# Patient Record
Sex: Female | Born: 1955 | Race: Black or African American | Hispanic: No | Marital: Married | Smoking: Never smoker
Health system: Southern US, Community
[De-identification: ages and names within clinical notes are randomized; demographics above are authoritative.]

## PROBLEM LIST (undated history)

## (undated) DIAGNOSIS — E079 Disorder of thyroid, unspecified: Secondary | ICD-10-CM

## (undated) DIAGNOSIS — I10 Essential (primary) hypertension: Secondary | ICD-10-CM

## (undated) HISTORY — PX: ABDOMINAL HYSTERECTOMY: SHX81

---

## 2004-09-06 ENCOUNTER — Ambulatory Visit: Payer: Self-pay | Admitting: General Practice

## 2005-10-09 ENCOUNTER — Ambulatory Visit: Payer: Self-pay | Admitting: General Practice

## 2006-10-14 ENCOUNTER — Ambulatory Visit: Payer: Self-pay | Admitting: Internal Medicine

## 2007-01-16 ENCOUNTER — Ambulatory Visit: Payer: Self-pay | Admitting: Obstetrics and Gynecology

## 2007-01-20 ENCOUNTER — Inpatient Hospital Stay: Payer: Self-pay | Admitting: Obstetrics and Gynecology

## 2007-09-30 ENCOUNTER — Emergency Department: Payer: Self-pay | Admitting: Emergency Medicine

## 2007-10-15 ENCOUNTER — Ambulatory Visit: Payer: Self-pay | Admitting: Internal Medicine

## 2008-10-04 ENCOUNTER — Ambulatory Visit: Payer: Self-pay

## 2008-11-10 ENCOUNTER — Ambulatory Visit: Payer: Self-pay

## 2009-10-17 IMAGING — CR NECK SOFT TISSUES - 1+ VIEW
1 series · 3 of 3 positions shown · non-contrast
Comparison: none

REASON FOR EXAM: Fish bone stuck in throat, pain
COMMENTS:

PROCEDURE:     DXR - DXR SOFT TISSUE NECK  - September 30, 2007  [DATE]
RESULT:     No radiopaque soft tissue foreign body is seen. The epiglottis
appears to be normal in size. No retropharyngeal soft tissue swelling is
seen. Incidental note is made that the hyoid bones are prominent.

[Series 1: view not recorded · 0.17mm/px · 3 of 3 slices shown]
[im 1/3]
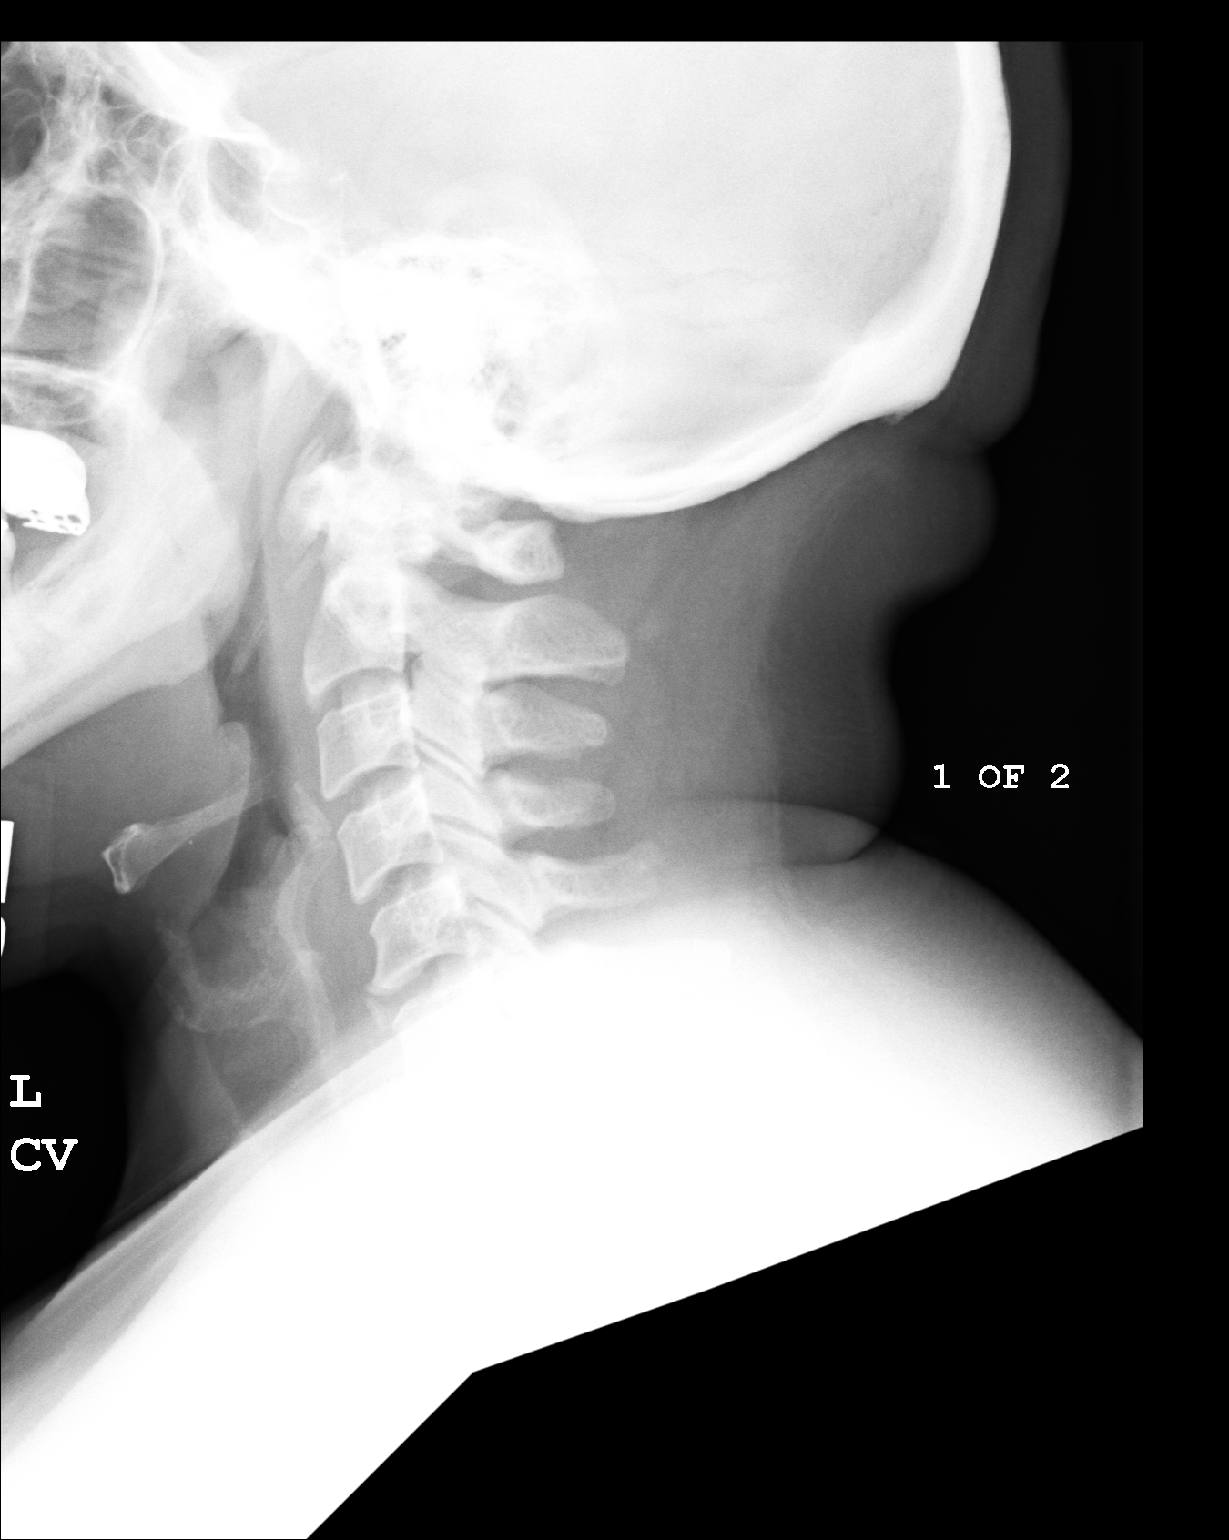
[im 2/3]
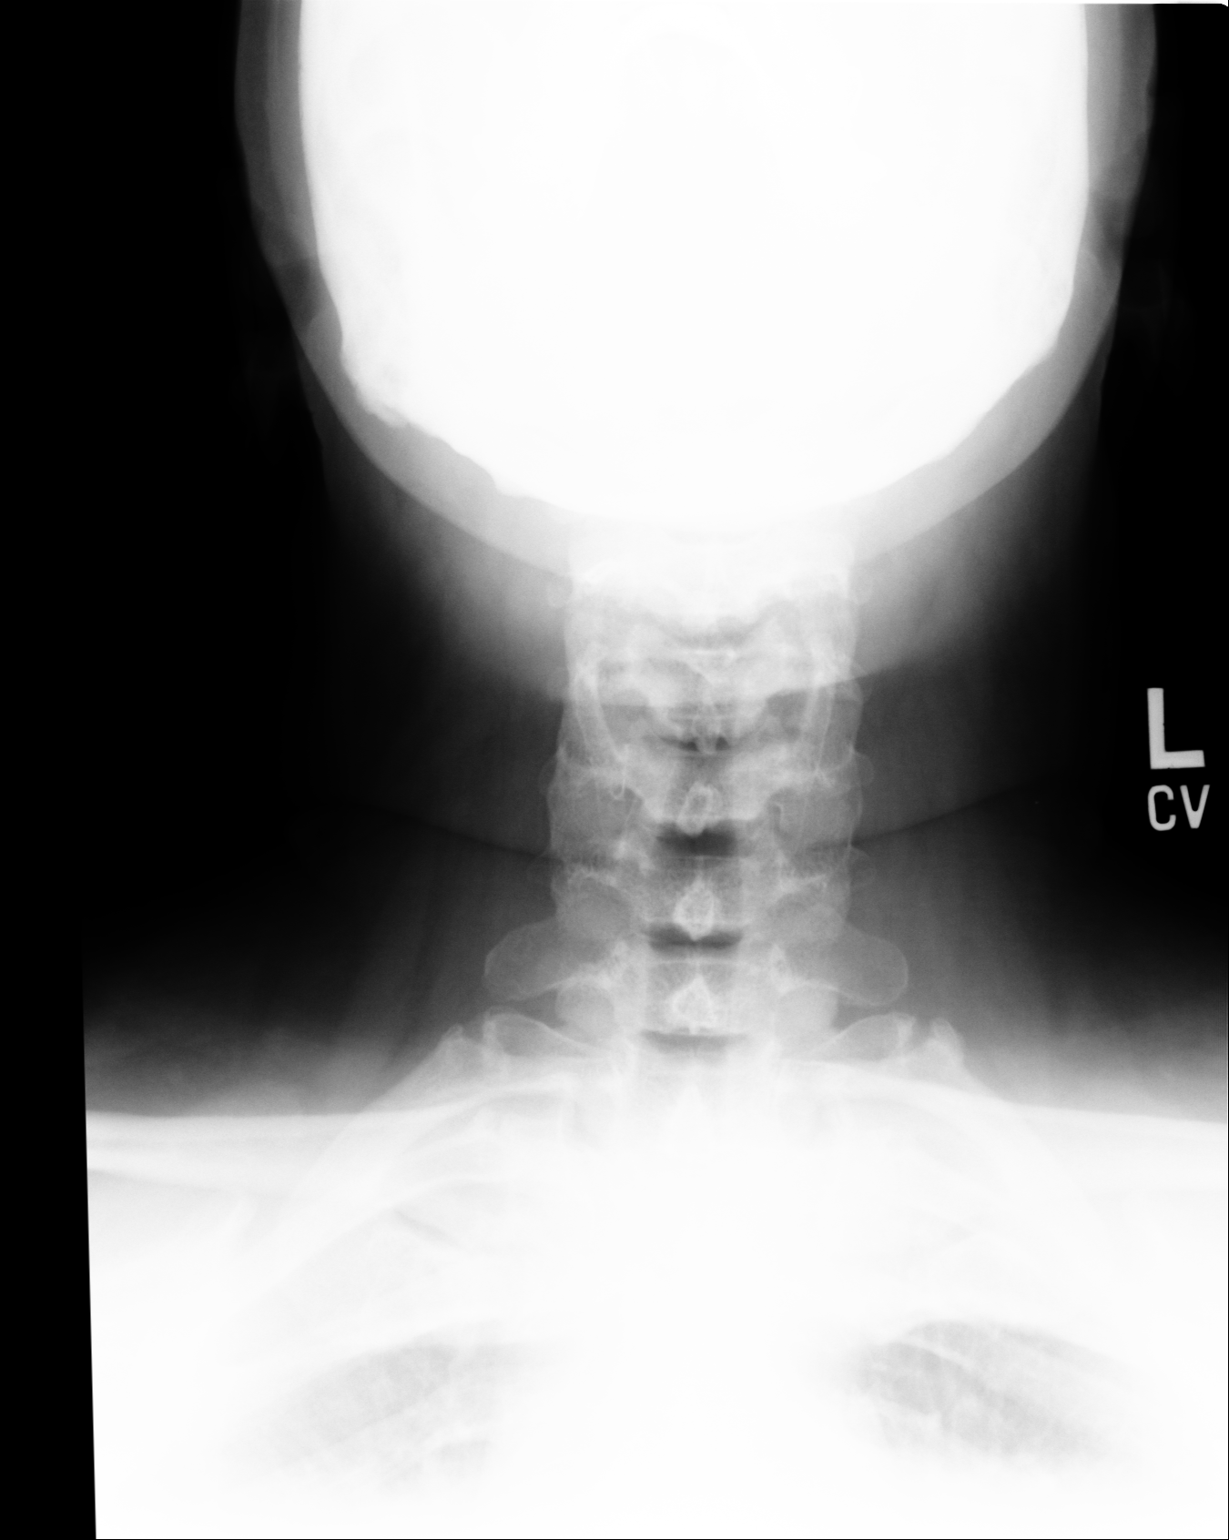
[im 3/3]
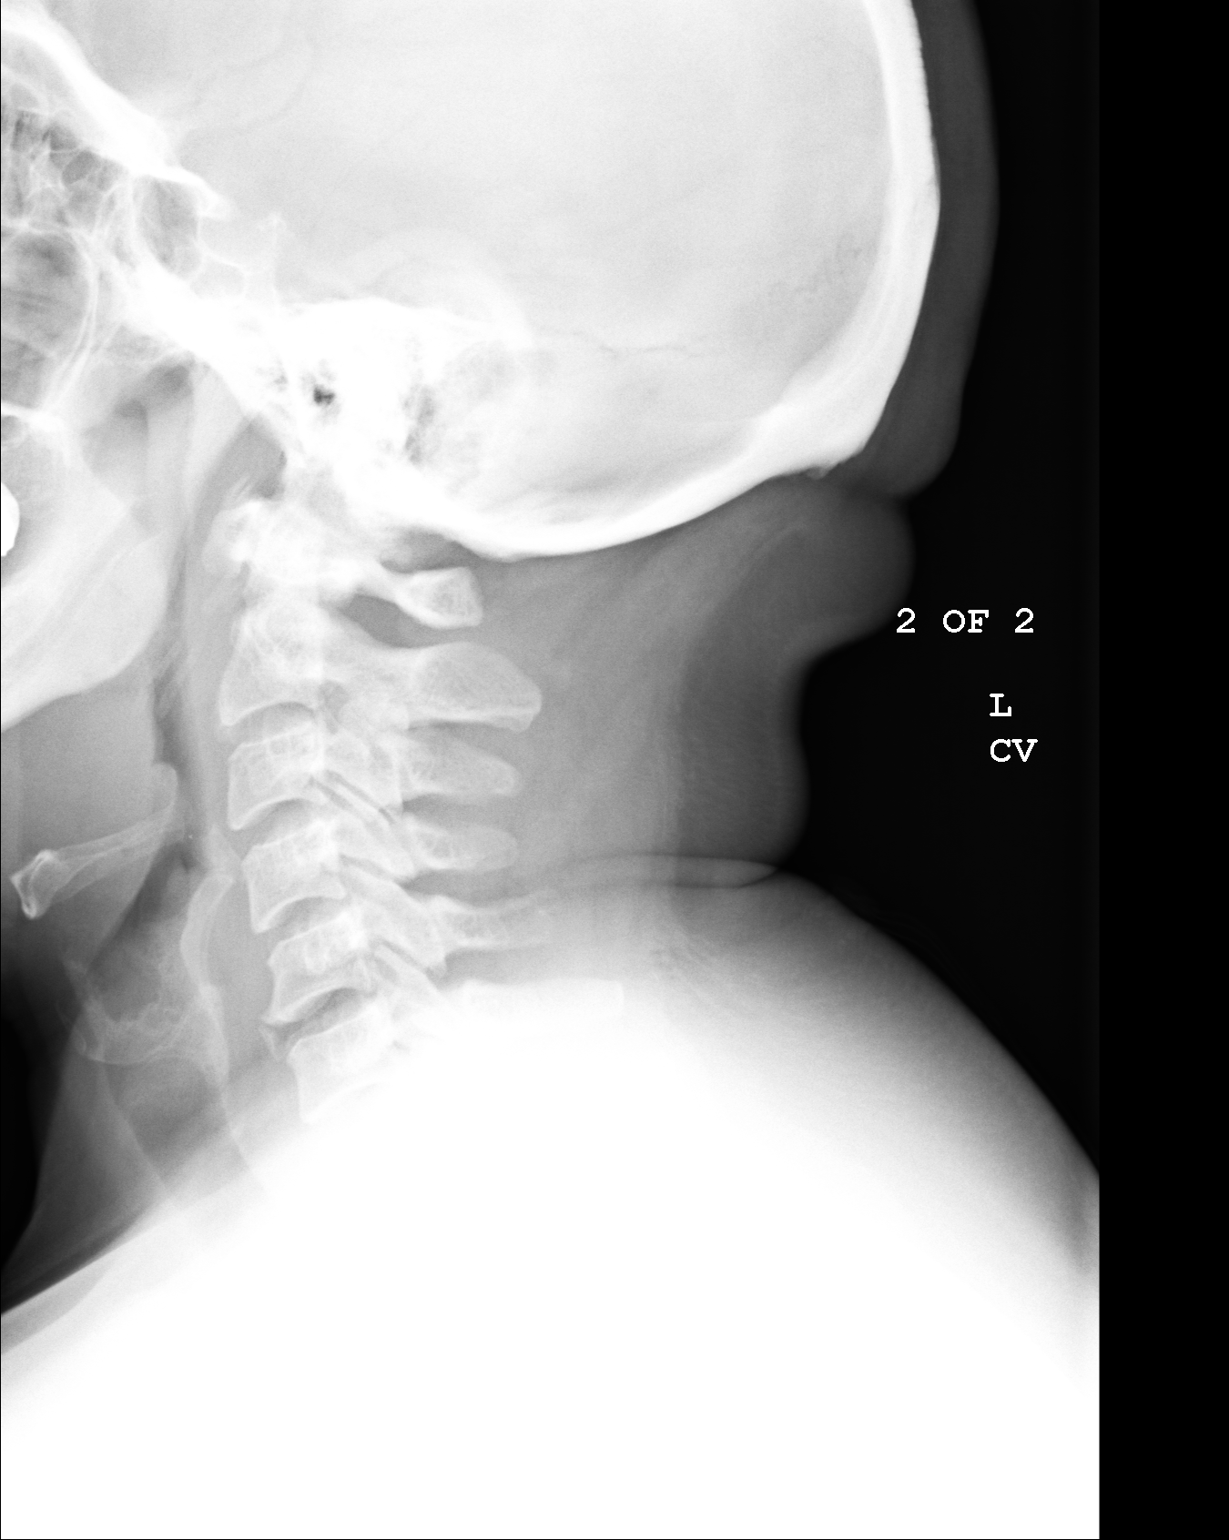

[3 of 3 positions shown; findings below may reference images not displayed]

IMPRESSION: 1.  No acute changes are identified.
2.  No definite opaque foreign body is noted in the cervical esophagus or
pharynx.
3.  Follow-up examination is recommended if symptomatology persists.
4.  Note is made that many fish bones are cartilaginous and may not
visualize on plain film radiography.

## 2010-01-04 ENCOUNTER — Ambulatory Visit: Payer: Self-pay

## 2010-11-14 ENCOUNTER — Ambulatory Visit: Payer: Self-pay | Admitting: Internal Medicine

## 2011-11-29 ENCOUNTER — Ambulatory Visit: Payer: Self-pay | Admitting: Internal Medicine

## 2012-01-18 ENCOUNTER — Ambulatory Visit: Payer: Self-pay | Admitting: General Practice

## 2012-03-21 ENCOUNTER — Ambulatory Visit: Payer: Self-pay | Admitting: Unknown Physician Specialty

## 2012-12-22 ENCOUNTER — Ambulatory Visit: Payer: Self-pay | Admitting: Internal Medicine

## 2014-02-15 ENCOUNTER — Ambulatory Visit: Payer: Self-pay | Admitting: Internal Medicine

## 2015-01-18 ENCOUNTER — Other Ambulatory Visit: Payer: Self-pay | Admitting: Internal Medicine

## 2015-01-18 DIAGNOSIS — Z1231 Encounter for screening mammogram for malignant neoplasm of breast: Secondary | ICD-10-CM

## 2015-02-17 ENCOUNTER — Ambulatory Visit
Admission: RE | Admit: 2015-02-17 | Discharge: 2015-02-17 | Disposition: A | Discharge status: Home / Self Care | Payer: 59 | Source: Ambulatory Visit | Attending: Internal Medicine | Admitting: Internal Medicine

## 2015-02-17 ENCOUNTER — Other Ambulatory Visit: Payer: Self-pay | Admitting: Internal Medicine

## 2015-02-17 DIAGNOSIS — Z1231 Encounter for screening mammogram for malignant neoplasm of breast: Secondary | ICD-10-CM | POA: Insufficient documentation

## 2015-07-13 DIAGNOSIS — Z0000 Encounter for general adult medical examination without abnormal findings: Secondary | ICD-10-CM | POA: Diagnosis not present

## 2015-07-20 DIAGNOSIS — E039 Hypothyroidism, unspecified: Secondary | ICD-10-CM | POA: Diagnosis not present

## 2015-07-20 DIAGNOSIS — I10 Essential (primary) hypertension: Secondary | ICD-10-CM | POA: Diagnosis not present

## 2015-07-20 DIAGNOSIS — E876 Hypokalemia: Secondary | ICD-10-CM | POA: Diagnosis not present

## 2016-02-16 DIAGNOSIS — E039 Hypothyroidism, unspecified: Secondary | ICD-10-CM | POA: Diagnosis not present

## 2016-02-16 DIAGNOSIS — I10 Essential (primary) hypertension: Secondary | ICD-10-CM | POA: Diagnosis not present

## 2016-02-21 DIAGNOSIS — Z0000 Encounter for general adult medical examination without abnormal findings: Secondary | ICD-10-CM | POA: Diagnosis not present

## 2016-04-12 ENCOUNTER — Other Ambulatory Visit: Payer: Self-pay | Admitting: Internal Medicine

## 2016-04-12 DIAGNOSIS — Z1231 Encounter for screening mammogram for malignant neoplasm of breast: Secondary | ICD-10-CM

## 2016-05-09 ENCOUNTER — Ambulatory Visit
Admission: RE | Admit: 2016-05-09 | Discharge: 2016-05-09 | Disposition: A | Discharge status: Home / Self Care | Payer: 59 | Source: Ambulatory Visit | Attending: Internal Medicine | Admitting: Internal Medicine

## 2016-05-09 DIAGNOSIS — Z1231 Encounter for screening mammogram for malignant neoplasm of breast: Secondary | ICD-10-CM | POA: Insufficient documentation

## 2016-08-14 DIAGNOSIS — Z0000 Encounter for general adult medical examination without abnormal findings: Secondary | ICD-10-CM | POA: Diagnosis not present

## 2016-08-21 DIAGNOSIS — E039 Hypothyroidism, unspecified: Secondary | ICD-10-CM | POA: Diagnosis not present

## 2016-08-21 DIAGNOSIS — E876 Hypokalemia: Secondary | ICD-10-CM | POA: Diagnosis not present

## 2016-08-21 DIAGNOSIS — I10 Essential (primary) hypertension: Secondary | ICD-10-CM | POA: Diagnosis not present

## 2017-05-22 DIAGNOSIS — Z0000 Encounter for general adult medical examination without abnormal findings: Secondary | ICD-10-CM | POA: Diagnosis not present

## 2017-05-23 ENCOUNTER — Other Ambulatory Visit: Payer: Self-pay | Admitting: Internal Medicine

## 2017-05-23 DIAGNOSIS — Z1231 Encounter for screening mammogram for malignant neoplasm of breast: Secondary | ICD-10-CM

## 2017-05-27 ENCOUNTER — Ambulatory Visit
Admission: RE | Admit: 2017-05-27 | Discharge: 2017-05-27 | Disposition: A | Discharge status: Home / Self Care | Payer: 59 | Source: Ambulatory Visit | Attending: Internal Medicine | Admitting: Internal Medicine

## 2017-05-27 DIAGNOSIS — Z1231 Encounter for screening mammogram for malignant neoplasm of breast: Secondary | ICD-10-CM | POA: Insufficient documentation

## 2017-05-27 DIAGNOSIS — H524 Presbyopia: Secondary | ICD-10-CM | POA: Diagnosis not present

## 2018-03-24 DIAGNOSIS — M5412 Radiculopathy, cervical region: Secondary | ICD-10-CM | POA: Diagnosis not present

## 2018-03-24 DIAGNOSIS — M542 Cervicalgia: Secondary | ICD-10-CM | POA: Diagnosis not present

## 2018-05-13 ENCOUNTER — Other Ambulatory Visit: Payer: Self-pay | Admitting: Internal Medicine

## 2018-05-13 DIAGNOSIS — Z1231 Encounter for screening mammogram for malignant neoplasm of breast: Secondary | ICD-10-CM

## 2018-05-23 DIAGNOSIS — Z0000 Encounter for general adult medical examination without abnormal findings: Secondary | ICD-10-CM | POA: Diagnosis not present

## 2018-05-28 ENCOUNTER — Ambulatory Visit
Admission: RE | Admit: 2018-05-28 | Discharge: 2018-05-28 | Disposition: A | Discharge status: Home / Self Care | Payer: 59 | Source: Ambulatory Visit | Attending: Internal Medicine | Admitting: Internal Medicine

## 2018-05-28 DIAGNOSIS — Z1231 Encounter for screening mammogram for malignant neoplasm of breast: Secondary | ICD-10-CM | POA: Insufficient documentation

## 2018-07-07 MED FILL — ESTRADIOL 0.5 MG TABS: 0.5 | 90 days supply | Qty: 90 | Fill #0

## 2018-07-10 MED FILL — LEVOTHYROXINE 125 MCG TABLE: 125 | 90 days supply | Qty: 90 | Fill #0

## 2018-10-22 MED FILL — LEVOTHYROXINE 125 MCG TABLE: 125 | 90 days supply | Qty: 90 | Fill #0

## 2018-10-22 MED FILL — SPIRONOLACTONE 25 MG TABS: 25 | 90 days supply | Qty: 90 | Fill #0

## 2018-10-22 MED FILL — POTASSIUM CL ER 10 MEQ TAB: 10 | 90 days supply | Qty: 270 | Fill #0

## 2018-10-22 MED FILL — HYDROCHLOROTHIAZIDE 50 MG T: 50 | 90 days supply | Qty: 90 | Fill #0

## 2018-12-16 MED FILL — ESTRADIOL 0.5 MG TABS: 0.5 | 90 days supply | Qty: 90 | Fill #1

## 2019-02-23 MED FILL — POTASSIUM CL ER 10 MEQ TAB: 10 | 90 days supply | Qty: 270 | Fill #1

## 2019-02-23 MED FILL — LEVOTHYROXINE 125 MCG TABLE: 125 | 90 days supply | Qty: 90 | Fill #1

## 2019-02-23 MED FILL — HYDROCHLOROTHIAZIDE 50 MG T: 50 | 90 days supply | Qty: 90 | Fill #1

## 2019-02-24 MED FILL — SPIRONOLACTONE 25 MG TABS: 25 | 90 days supply | Qty: 90 | Fill #0

## 2019-03-20 ENCOUNTER — Other Ambulatory Visit: Payer: Self-pay

## 2019-03-20 DIAGNOSIS — Z20822 Contact with and (suspected) exposure to covid-19: Secondary | ICD-10-CM

## 2019-03-22 LAB — NOVEL CORONAVIRUS, NAA: SARS-CoV-2, NAA: NOT DETECTED

## 2019-04-02 MED FILL — ESTRADIOL 0.5 MG TABS: 0.5 | 90 days supply | Qty: 90 | Fill #2

## 2019-04-13 MED FILL — ESTRADIOL 0.5 MG TABS: 0.5 | 90 days supply | Qty: 90 | Fill #2

## 2019-05-19 ENCOUNTER — Other Ambulatory Visit: Payer: Self-pay | Admitting: Internal Medicine

## 2019-06-01 ENCOUNTER — Telehealth: Payer: Self-pay

## 2019-06-02 ENCOUNTER — Ambulatory Visit
Admission: RE | Admit: 2019-06-02 | Discharge: 2019-06-02 | Disposition: A | Discharge status: Home / Self Care | Payer: Self-pay | Source: Ambulatory Visit | Attending: Oncology | Admitting: Oncology

## 2019-06-02 ENCOUNTER — Ambulatory Visit: Payer: Self-pay | Attending: Oncology | Admitting: *Deleted

## 2019-06-02 ENCOUNTER — Other Ambulatory Visit: Payer: Self-pay

## 2019-06-02 VITALS — BP 141/75 | HR 72 | Temp 97.0°F | Ht 65.0 in | Wt 272.0 lb

## 2019-06-02 DIAGNOSIS — Z1211 Encounter for screening for malignant neoplasm of colon: Secondary | ICD-10-CM

## 2019-06-02 NOTE — Patient Instructions (Signed)
Gave patient hand-out, Women Staying Healthy, Active and Well from [facility], with education on breast health, pap smears, heart and colon health. 

## 2019-06-02 NOTE — Progress Notes (Signed)
  Subjective:     Patient ID: Katrina Lara, female   DOB: [birth year], 64 y.o.   MRN: [id number]  HPI   Review of Systems     Objective:   Physical Exam Chest:     Breasts:        Right: No swelling, bleeding, inverted nipple, mass, nipple discharge, skin change or tenderness.        Left: No swelling, bleeding, inverted nipple, mass, nipple discharge, skin change or tenderness.     Comments: Large pendulous breast Lymphadenopathy:     Upper Body:     Right upper body: No supraclavicular or axillary adenopathy.     Left upper body: No supraclavicular or axillary adenopathy.        Assessment:     64 year old Black female presents to [facility] for clinical breast exam and mammogram.  Clinical breast exam unremarkable.  Taught self breast awareness.  Patient with a history of hysterectomy for heavy bleeding.  Pap omitted per protocol.  Patient has been screened for eligibility.  She does not have any insurance, Medicare or Medicaid.  She also meets financial eligibility.   Risk Assessment    Risk Scores      06/02/2019   Last edited by: Scarlett Presto, RN   5-year risk: 1.1 %   Lifetime risk: 5.2 %            Plan:     Screening mammogram ordered.  Will follow up per [facility] protocol.

## 2019-06-04 ENCOUNTER — Encounter: Payer: Self-pay | Admitting: *Deleted

## 2019-06-04 NOTE — Progress Notes (Signed)
Letter mailed from the [facility] to inform patient of her normal mammogram results.  Patient is to follow-up with annual screening in one year. 

## 2019-06-08 MED FILL — LEVOTHYROXINE 125 MCG TABLE: 125 | 90 days supply | Qty: 90 | Fill #0

## 2019-06-08 MED FILL — SPIRONOLACTONE 25 MG TABS: 25 | 90 days supply | Qty: 90 | Fill #1

## 2019-06-08 MED FILL — HYDROCHLOROTHIAZIDE 50 MG T: 50 | 90 days supply | Qty: 90 | Fill #0

## 2019-07-16 ENCOUNTER — Ambulatory Visit: Payer: Self-pay | Attending: Internal Medicine

## 2019-07-16 DIAGNOSIS — Z23 Encounter for immunization: Secondary | ICD-10-CM

## 2019-07-16 NOTE — Progress Notes (Signed)
   Covid-19 Vaccination Clinic  Name:  Katrina Lara    MRN: [id number] DOB: 11-10-1955  07/16/2019  [name] was observed post Covid-19 immunization for 15 minutes without incident. She was provided with Vaccine Information Sheet and instruction to access the V-Safe system.   [name] was instructed to call 911 with any severe reactions post vaccine: Marland Kitchen Difficulty breathing  . Swelling of face and throat  . A fast heartbeat  . A bad rash all over body  . Dizziness and weakness   Immunizations Administered    Name Date Dose VIS Date Route   Pfizer COVID-19 Vaccine 07/16/2019  9:45 AM 0.3 mL 03/27/2019 Intramuscular   Manufacturer: [employer], [employer]   Lot: [id number]   NDC: [address]-2

## 2019-08-11 ENCOUNTER — Other Ambulatory Visit: Payer: Self-pay

## 2019-08-11 ENCOUNTER — Emergency Department: Payer: Self-pay

## 2019-08-11 ENCOUNTER — Emergency Department
Admission: EM | Admit: 2019-08-11 | Discharge: 2019-08-11 | Disposition: A | Discharge status: Home / Self Care | Payer: Self-pay | Attending: Emergency Medicine | Admitting: Emergency Medicine

## 2019-08-11 ENCOUNTER — Ambulatory Visit: Payer: Self-pay | Attending: Internal Medicine

## 2019-08-11 DIAGNOSIS — I10 Essential (primary) hypertension: Secondary | ICD-10-CM | POA: Insufficient documentation

## 2019-08-11 DIAGNOSIS — Z23 Encounter for immunization: Secondary | ICD-10-CM

## 2019-08-11 DIAGNOSIS — R1084 Generalized abdominal pain: Secondary | ICD-10-CM | POA: Insufficient documentation

## 2019-08-11 HISTORY — DX: Essential (primary) hypertension: I10

## 2019-08-11 HISTORY — DX: Disorder of thyroid, unspecified: E07.9

## 2019-08-11 LAB — COMPREHENSIVE METABOLIC PANEL
ALT: 26 U/L (ref 0–44)
AST: 33 U/L (ref 15–41)
Albumin: 4.1 g/dL (ref 3.5–5.0)
Alkaline Phosphatase: 77 U/L (ref 38–126)
Anion gap: 14 (ref 5–15)
BUN: 16 mg/dL (ref 8–23)
CO2: 21 mmol/L — ABNORMAL LOW (ref 22–32)
Calcium: 9.2 mg/dL (ref 8.9–10.3)
Chloride: 99 mmol/L (ref 98–111)
Creatinine, Ser: 1.08 mg/dL — ABNORMAL HIGH (ref 0.44–1.00)
GFR calc Af Amer: >60 mL/min (ref >60)
GFR calc non Af Amer: 55 mL/min — ABNORMAL LOW (ref >60)
Glucose, Bld: 100 mg/dL — ABNORMAL HIGH (ref 70–99)
Potassium: 4.3 mmol/L (ref 3.5–5.1)
Sodium: 134 mmol/L — ABNORMAL LOW (ref 135–145)
Total Bilirubin: 1.4 mg/dL — ABNORMAL HIGH (ref 0.3–1.2)
Total Protein: 8.9 g/dL — ABNORMAL HIGH (ref 6.5–8.1)

## 2019-08-11 LAB — URINALYSIS, COMPLETE (UACMP) WITH MICROSCOPIC
Bacteria, UA: NONE SEEN
Bilirubin Urine: NEGATIVE
Glucose, UA: NEGATIVE mg/dL
Hgb urine dipstick: NEGATIVE
Ketones, ur: NEGATIVE mg/dL
Leukocytes,Ua: NEGATIVE
Nitrite: NEGATIVE
Protein, ur: NEGATIVE mg/dL
Specific Gravity, Urine: 1.042 — ABNORMAL HIGH (ref 1.005–1.030)
pH: 5 (ref 5.0–8.0)

## 2019-08-11 LAB — CBC
HCT: 41.7 % (ref 36.0–46.0)
Hemoglobin: 13.5 g/dL (ref 12.0–15.0)
MCH: 29.7 pg (ref 26.0–34.0)
MCHC: 32.4 g/dL (ref 30.0–36.0)
MCV: 91.9 fL (ref 80.0–100.0)
Platelets: 269 10*3/uL (ref 150–400)
RBC: 4.54 MIL/uL (ref 3.87–5.11)
RDW: 13.1 % (ref 11.5–15.5)
WBC: 11.1 10*3/uL — ABNORMAL HIGH (ref 4.0–10.5)
nRBC: 0 % (ref 0.0–0.2)

## 2019-08-11 LAB — LIPASE, BLOOD: Lipase: 27 U/L (ref 11–51)

## 2019-08-11 MED ORDER — SODIUM CHLORIDE 0.9% FLUSH
3.0000 mL | Freq: Once | INTRAVENOUS | Status: AC
  Administered 2019-08-11: 20:00:00 3 mL via INTRAVENOUS

## 2019-08-11 MED ORDER — HYDROMORPHONE HCL 1 MG/ML IJ SOLN
0.5000 mg | Freq: Once | INTRAMUSCULAR | Status: AC
  Administered 2019-08-11: 20:00:00 0.5 mg via INTRAVENOUS
  Filled 2019-08-11: qty 1

## 2019-08-11 MED ORDER — PROMETHAZINE HCL 25 MG PO TABS
25.0000 mg | ORAL_TABLET | Freq: Three times a day (TID) | ORAL | 0 refills | Status: DC | PRN
Start: 2019-08-11 — End: 2020-05-06

## 2019-08-11 MED ORDER — IOHEXOL 300 MG/ML  SOLN
100.0000 mL | Freq: Once | INTRAMUSCULAR | Status: AC | PRN
  Administered 2019-08-11: 20:00:00 100 mL via INTRAVENOUS

## 2019-08-11 MED ORDER — SODIUM CHLORIDE 0.9 % IV BOLUS
1000.0000 mL | Freq: Once | INTRAVENOUS | Status: AC
  Administered 2019-08-11: 1000 mL via INTRAVENOUS

## 2019-08-11 MED ORDER — ONDANSETRON HCL 4 MG/2ML IJ SOLN
4.0000 mg | Freq: Once | INTRAMUSCULAR | Status: AC
  Administered 2019-08-11: 20:00:00 4 mg via INTRAVENOUS
  Filled 2019-08-11: qty 2

## 2019-08-11 MED ORDER — DICYCLOMINE HCL 20 MG PO TABS: 20.0000 mg | ORAL_TABLET | Freq: Three times a day (TID) | ORAL | 0 refills | Status: DC

## 2019-08-11 NOTE — ED Notes (Signed)
Pt to toilet to provided urine sample on own power.

## 2019-08-11 NOTE — ED Provider Notes (Signed)
[facility] Emergency Department Provider Note  ____________________________________________   First MD Initiated Contact with Patient 08/11/19 1937     (approximate)  I have reviewed the triage vital signs and the nursing notes.   HISTORY  Chief Complaint Abdominal Pain    HPI Katrina Lara is a 64 y.o. female with hypertension who comes in with abdominal pain.  Patient's abdominal pain started on Saturday.  Patient stated that she ate out at a restaurant and she started having abdominal pain.  The abdominal pain is located in the left lower quadrant, moderate, worse after eating, nothing seems to make it better.  Patient states that she has not had a bowel movement for a few days and took some MiraLAX and had some smaller bowel movements.  Does not feel any pressure in her rectum.  Patient states that every time she eats or drink she has more back pain than anything.  Denies any fevers.  Physical nauseous but no vomiting.  Has had a prior hysterectomy but no prior abdominal surgeries.  She denies any chest pain or shortness of breath.          Past Medical History:  Diagnosis Date  . Hypertension   . Thyroid disease     There are no problems to display for this patient.   Past Surgical History:  Procedure Laterality Date  . ABDOMINAL HYSTERECTOMY      Prior to Admission medications   Not on File    Allergies Patient has no allergy information on record.  Family History  Problem Relation Age of Onset  . Breast cancer Neg Hx     Social History Denies drugs or daily drinking  Review of Systems Constitutional: No fever/chills Eyes: No visual changes. ENT: No sore throat. Cardiovascular: Denies chest pain. Respiratory: Denies shortness of breath. Gastrointestinal: Positive abdominal pain, nausea, constipation Genitourinary: Negative for dysuria. Musculoskeletal: Negative for back pain. Skin: Negative for rash. Neurological:  Negative for headaches, focal weakness or numbness. All other ROS negative ____________________________________________   PHYSICAL EXAM:  VITAL SIGNS: ED Triage Vitals [08/11/19 1706]  Enc Vitals Group     BP (!) 157/79     Pulse Rate (!) 109     Resp 19     Temp 99.7 F (37.6 C)     Temp src      SpO2 100 %     Weight 270 lb (122.5 kg)     Height 5\' 4"  (1.626 m)     Head Circumference      Peak Flow      Pain Score 10     Pain Loc      Pain Edu?      Excl. in GC?     Constitutional: Alert and oriented. Well appearing and in no acute distress. Eyes: Conjunctivae are normal. EOMI. Head: Atraumatic. Nose: No congestion/rhinnorhea. Mouth/Throat: Mucous membranes are moist.   Neck: No stridor. Trachea Midline. FROM Cardiovascular: Normal rate, regular rhythm. Grossly normal heart sounds.  Good peripheral circulation. Respiratory: Normal respiratory effort.  No retractions. Lungs CTAB. Gastrointestinal: Soft but tender in the left lower quadrant.  No distention. No abdominal bruits.  Musculoskeletal: No lower extremity tenderness nor edema.  No joint effusions. Neurologic:  Normal speech and language. No gross focal neurologic deficits are appreciated.  Skin:  Skin is warm, dry and intact. No rash noted. Psychiatric: Mood and affect are normal. Speech and behavior are normal. GU: Deferred   ____________________________________________   LABS (  all labs ordered are listed, but only abnormal results are displayed)  Labs Reviewed  COMPREHENSIVE METABOLIC PANEL - Abnormal; Notable for the following components:      Result Value   Sodium 134 (*)    CO2 21 (*)    Glucose, Bld 100 (*)    Creatinine, Ser 1.08 (*)    Total Protein 8.9 (*)    Total Bilirubin 1.4 (*)    GFR calc non Af Amer 55 (*)    All other components within normal limits  LIPASE, BLOOD  URINALYSIS, COMPLETE (UACMP) WITH MICROSCOPIC  CBC    ____________________________________________    RADIOLOGY Robert Bellow, personally viewed and evaluated these images (plain radiographs) as part of my medical decision making, as well as reviewing the written report by the radiologist.  ED MD interpretation:  Pending   Official radiology report(s): No results found.  ____________________________________________   PROCEDURES  Procedure(s) performed (including Critical Care):  Procedures   ____________________________________________   INITIAL IMPRESSION / ASSESSMENT AND PLAN / ED COURSE  Katrina Lara was evaluated in Emergency Department on 08/11/2019 for the symptoms described in the history of present illness. She was evaluated in the context of the global COVID-19 pandemic, which necessitated consideration that the patient might be at risk for infection with the SARS-CoV-2 virus that causes COVID-19. Institutional protocols and algorithms that pertain to the evaluation of patients at risk for COVID-19 are in a state of rapid change based on information released by regulatory bodies including the CDC and federal and state organizations. These policies and algorithms were followed during the patient's care in the ED.    Patient is a 64 year old who comes in with abdominal pain some elevated heart rates and low-grade temperatures.  Patient is left lower quadrant pain we discussed CT imaging to make sure no evidence of diverticulitis, obstruction.  No right lower quadrant tenderness to suggest appendicitis or right upper quadrant tenderness to suggest cholecystitis.  Denies any urinary symptoms suggest UTI.  Suspect elevated heart rate secondary to dehydration.  She denies any chest pain or shortness of breath.  Will give some fluids, IV Dilaudid IV Zofran to help with discomfort and will proceed with CT for further evaluation.  White count is slightly elevated 11.1  Labs show slight elevation her kidney function of 1.08 patient  is getting fluids  Pt handed off pending CT scan.      ____________________________________________   FINAL CLINICAL IMPRESSION(S) / ED DIAGNOSES   Final diagnoses:  None      MEDICATIONS GIVEN DURING THIS VISIT:  Medications  sodium chloride flush (NS) 0.9 % injection 3 mL (has no administration in time range)     ED Discharge Orders    None       Note:  This document was prepared using Dragon voice recognition software and may include unintentional dictation errors.   Vanessa , MD 08/11/19 2020

## 2019-08-11 NOTE — ED Notes (Signed)
Pt provided water and crackers for po challange

## 2019-08-11 NOTE — ED Notes (Addendum)
PT to CT. Pt made aware of need for urine sample prior to going to CT. States she does not need to urinate at this time but will inform this nurse when she can urinate.

## 2019-08-11 NOTE — ED Provider Notes (Signed)
Assumed care from Dr. Fuller Plan at 8 PM. Briefly, the patient is a 64 y.o. female with PMHx of  has a past medical history of Hypertension and Thyroid disease. here with abdominal pain. CT pending.   Labs Reviewed  COMPREHENSIVE METABOLIC PANEL - Abnormal; Notable for the following components:      Result Value   Sodium 134 (*)    CO2 21 (*)    Glucose, Bld 100 (*)    Creatinine, Ser 1.08 (*)    Total Protein 8.9 (*)    Total Bilirubin 1.4 (*)    GFR calc non Af Amer 55 (*)    All other components within normal limits  URINALYSIS, COMPLETE (UACMP) WITH MICROSCOPIC - Abnormal; Notable for the following components:   Color, Urine YELLOW (*)    APPearance HAZY (*)    Specific Gravity, Urine 1.042 (*)    All other components within normal limits  CBC - Abnormal; Notable for the following components:   WBC 11.1 (*)    All other components within normal limits  LIPASE, BLOOD    Course of Care: -CT negative. UA negative, mild dehydration noted. Pt tolerating PO. Labs, imaging reviewed. Per Dr. Fuller Plan and pt, likely food-related vs viral GI. Feels much better in ED. Will treat symptomatically, d/c.     Shaune Pollack, MD 08/12/19 [phone]

## 2019-08-11 NOTE — Progress Notes (Signed)
   Covid-19 Vaccination Clinic  Name:  Katrina Lara    MRN: [id number] DOB: 07/09/1955  08/11/2019  [name] was observed post Covid-19 immunization for 15 minutes without incident. She was provided with Vaccine Information Sheet and instruction to access the V-Safe system.   [name] was instructed to call 911 with any severe reactions post vaccine: Marland Kitchen Difficulty breathing  . Swelling of face and throat  . A fast heartbeat  . A bad rash all over body  . Dizziness and weakness   Immunizations Administered    Name Date Dose VIS Date Route   Pfizer COVID-19 Vaccine 08/11/2019 10:21 AM 0.3 mL 06/10/2018 Intramuscular   Manufacturer: [employer], [employer]   Lot: [id number]   NDC: [address]-8

## 2019-08-11 NOTE — ED Triage Notes (Signed)
Pt comes via POV from home with c/o abdominal pain. Pt states this started on Saturday and is getting worse. Pt states lower belly pain.  Pt denies any N/V/D. Pt states she hasn't had BM lately either.  Pt states it hurts to walk, sit and eat.

## 2019-08-19 ENCOUNTER — Emergency Department: Payer: Self-pay

## 2019-08-19 ENCOUNTER — Encounter: Payer: Self-pay | Admitting: Emergency Medicine

## 2019-08-19 ENCOUNTER — Other Ambulatory Visit: Payer: Self-pay

## 2019-08-19 ENCOUNTER — Inpatient Hospital Stay
Admission: EM | Admit: 2019-08-19 | Discharge: 2019-08-20 | DRG: 442 | Disposition: A | Discharge status: Home / Self Care | Payer: Self-pay | Source: Ambulatory Visit | Attending: Internal Medicine | Admitting: Internal Medicine

## 2019-08-19 DIAGNOSIS — Z79899 Other long term (current) drug therapy: Secondary | ICD-10-CM

## 2019-08-19 DIAGNOSIS — I81 Portal vein thrombosis: Principal | ICD-10-CM

## 2019-08-19 DIAGNOSIS — E669 Obesity, unspecified: Secondary | ICD-10-CM | POA: Diagnosis present

## 2019-08-19 DIAGNOSIS — I10 Essential (primary) hypertension: Secondary | ICD-10-CM | POA: Diagnosis present

## 2019-08-19 DIAGNOSIS — E039 Hypothyroidism, unspecified: Secondary | ICD-10-CM | POA: Diagnosis present

## 2019-08-19 DIAGNOSIS — Z8249 Family history of ischemic heart disease and other diseases of the circulatory system: Secondary | ICD-10-CM

## 2019-08-19 DIAGNOSIS — Z6841 Body Mass Index (BMI) 40.0 and over, adult: Secondary | ICD-10-CM

## 2019-08-19 DIAGNOSIS — Z79890 Hormone replacement therapy (postmenopausal): Secondary | ICD-10-CM

## 2019-08-19 DIAGNOSIS — Z20822 Contact with and (suspected) exposure to covid-19: Secondary | ICD-10-CM | POA: Diagnosis present

## 2019-08-19 LAB — CBC WITH DIFFERENTIAL/PLATELET
Abs Immature Granulocytes: 0.04 10*3/uL (ref 0.00–0.07)
Basophils Absolute: 0.1 10*3/uL (ref 0.0–0.1)
Basophils Relative: 1 %
Eosinophils Absolute: 0.1 10*3/uL (ref 0.0–0.5)
Eosinophils Relative: 1 %
HCT: 40.1 % (ref 36.0–46.0)
Hemoglobin: 12.7 g/dL (ref 12.0–15.0)
Immature Granulocytes: 1 %
Lymphocytes Relative: 27 %
Lymphs Abs: 2.4 10*3/uL (ref 0.7–4.0)
MCH: 29.1 pg (ref 26.0–34.0)
MCHC: 31.7 g/dL (ref 30.0–36.0)
MCV: 92 fL (ref 80.0–100.0)
Monocytes Absolute: 0.6 10*3/uL (ref 0.1–1.0)
Monocytes Relative: 6 %
Neutro Abs: 5.7 10*3/uL (ref 1.7–7.7)
Neutrophils Relative %: 64 %
Platelets: 316 10*3/uL (ref 150–400)
RBC: 4.36 MIL/uL (ref 3.87–5.11)
RDW: 12.7 % (ref 11.5–15.5)
WBC: 8.8 10*3/uL (ref 4.0–10.5)
nRBC: 0 % (ref 0.0–0.2)

## 2019-08-19 LAB — COMPREHENSIVE METABOLIC PANEL
ALT: 42 U/L (ref 0–44)
AST: 43 U/L — ABNORMAL HIGH (ref 15–41)
Albumin: 3.5 g/dL (ref 3.5–5.0)
Alkaline Phosphatase: 93 U/L (ref 38–126)
Anion gap: 10 (ref 5–15)
BUN: 15 mg/dL (ref 8–23)
CO2: 24 mmol/L (ref 22–32)
Calcium: 9.2 mg/dL (ref 8.9–10.3)
Chloride: 98 mmol/L (ref 98–111)
Creatinine, Ser: 1.18 mg/dL — ABNORMAL HIGH (ref 0.44–1.00)
GFR calc Af Amer: 58 mL/min — ABNORMAL LOW (ref >60)
GFR calc non Af Amer: 50 mL/min — ABNORMAL LOW (ref >60)
Glucose, Bld: 107 mg/dL — ABNORMAL HIGH (ref 70–99)
Potassium: 3.4 mmol/L — ABNORMAL LOW (ref 3.5–5.1)
Sodium: 132 mmol/L — ABNORMAL LOW (ref 135–145)
Total Bilirubin: 0.8 mg/dL (ref 0.3–1.2)
Total Protein: 9.3 g/dL — ABNORMAL HIGH (ref 6.5–8.1)

## 2019-08-19 LAB — PROTIME-INR
INR: 1.1 (ref 0.8–1.2)
Prothrombin Time: 13.6 seconds (ref 11.4–15.2)

## 2019-08-19 LAB — RESPIRATORY PANEL BY RT PCR (FLU A&B, COVID)
Influenza A by PCR: NEGATIVE
Influenza B by PCR: NEGATIVE
SARS Coronavirus 2 by RT PCR: NEGATIVE

## 2019-08-19 LAB — URINALYSIS, COMPLETE (UACMP) WITH MICROSCOPIC
Bilirubin Urine: NEGATIVE
Glucose, UA: NEGATIVE mg/dL
Hgb urine dipstick: NEGATIVE
Ketones, ur: NEGATIVE mg/dL
Leukocytes,Ua: NEGATIVE
Nitrite: NEGATIVE
Protein, ur: NEGATIVE mg/dL
Specific Gravity, Urine: 1.017 (ref 1.005–1.030)
pH: 6 (ref 5.0–8.0)

## 2019-08-19 LAB — LACTIC ACID, PLASMA: Lactic Acid, Venous: 1.8 mmol/L (ref 0.5–1.9)

## 2019-08-19 LAB — LIPASE, BLOOD: Lipase: 30 U/L (ref 11–51)

## 2019-08-19 LAB — APTT: aPTT: 29 seconds (ref 24–36)

## 2019-08-19 MED ORDER — HEPARIN BOLUS VIA INFUSION
4200.0000 [IU] | Freq: Once | INTRAVENOUS | Status: AC
  Administered 2019-08-19: 4200 [IU] via INTRAVENOUS
  Filled 2019-08-19: qty 4200

## 2019-08-19 MED ORDER — HYDROCHLOROTHIAZIDE 25 MG PO TABS: 50.0000 mg | ORAL_TABLET | Freq: Every day | ORAL | Status: DC

## 2019-08-19 MED ORDER — LEVOTHYROXINE SODIUM 25 MCG PO TABS
125.0000 ug | ORAL_TABLET | Freq: Every day | ORAL | Status: DC
  Administered 2019-08-20: 125 ug via ORAL
  Filled 2019-08-19: qty 1

## 2019-08-19 MED ORDER — PANTOPRAZOLE SODIUM 40 MG PO TBEC
40.0000 mg | DELAYED_RELEASE_TABLET | Freq: Every day | ORAL | Status: DC
  Administered 2019-08-20: 40 mg via ORAL
  Filled 2019-08-19: qty 1

## 2019-08-19 MED ORDER — PROMETHAZINE HCL 25 MG PO TABS
25.0000 mg | ORAL_TABLET | Freq: Three times a day (TID) | ORAL | Status: DC | PRN
  Filled 2019-08-19: qty 1

## 2019-08-19 MED ORDER — POTASSIUM CHLORIDE CRYS ER 20 MEQ PO TBCR
10.0000 meq | EXTENDED_RELEASE_TABLET | Freq: Three times a day (TID) | ORAL | Status: DC
  Administered 2019-08-19 – 2019-08-20 (×3): 10 meq via ORAL
  Filled 2019-08-19 (×3): qty 1

## 2019-08-19 MED ORDER — SODIUM CHLORIDE 0.9 % IV BOLUS
500.0000 mL | Freq: Once | INTRAVENOUS | Status: AC
  Administered 2019-08-19: 500 mL via INTRAVENOUS

## 2019-08-19 MED ORDER — SENNA 8.6 MG PO TABS
1.0000 | ORAL_TABLET | Freq: Two times a day (BID) | ORAL | Status: DC
  Administered 2019-08-19 – 2019-08-20 (×2): 8.6 mg via ORAL
  Filled 2019-08-19 (×2): qty 1

## 2019-08-19 MED ORDER — SODIUM CHLORIDE 0.45 % IV SOLN: INTRAVENOUS | Status: AC

## 2019-08-19 MED ORDER — DICYCLOMINE HCL 20 MG PO TABS: 20.0000 mg | ORAL_TABLET | Freq: Three times a day (TID) | ORAL | Status: DC

## 2019-08-19 MED ORDER — KETOROLAC TROMETHAMINE 30 MG/ML IJ SOLN
30.0000 mg | Freq: Four times a day (QID) | INTRAMUSCULAR | Status: DC | PRN
  Administered 2019-08-19 – 2019-08-20 (×2): 30 mg via INTRAVENOUS
  Filled 2019-08-19 (×2): qty 1

## 2019-08-19 MED ORDER — ZOLPIDEM TARTRATE 5 MG PO TABS: 5.0000 mg | ORAL_TABLET | Freq: Every evening | ORAL | Status: DC | PRN

## 2019-08-19 MED ORDER — IOHEXOL 9 MG/ML PO SOLN
500.0000 mL | ORAL | Status: AC
  Administered 2019-08-19: 500 mL via ORAL

## 2019-08-19 MED ORDER — ONDANSETRON HCL 4 MG/2ML IJ SOLN
4.0000 mg | Freq: Once | INTRAMUSCULAR | Status: AC
  Administered 2019-08-19: 4 mg via INTRAVENOUS
  Filled 2019-08-19: qty 2

## 2019-08-19 MED ORDER — DICYCLOMINE HCL 20 MG PO TABS
20.0000 mg | ORAL_TABLET | Freq: Three times a day (TID) | ORAL | Status: DC
  Administered 2019-08-19 – 2019-08-20 (×3): 20 mg via ORAL
  Filled 2019-08-19 (×6): qty 1

## 2019-08-19 MED ORDER — MORPHINE SULFATE (PF) 4 MG/ML IV SOLN
4.0000 mg | Freq: Once | INTRAVENOUS | Status: AC
  Administered 2019-08-19: 4 mg via INTRAVENOUS
  Filled 2019-08-19: qty 1

## 2019-08-19 MED ORDER — SPIRONOLACTONE 25 MG PO TABS
25.0000 mg | ORAL_TABLET | Freq: Every day | ORAL | Status: DC
  Administered 2019-08-19 – 2019-08-20 (×2): 25 mg via ORAL
  Filled 2019-08-19 (×3): qty 1

## 2019-08-19 MED ORDER — HEPARIN (PORCINE) 25000 UT/250ML-% IV SOLN
1300.0000 [IU]/h | INTRAVENOUS | Status: DC
  Administered 2019-08-19 – 2019-08-20 (×2): 1300 [IU]/h via INTRAVENOUS
  Filled 2019-08-19 (×2): qty 250

## 2019-08-19 MED ORDER — IOHEXOL 300 MG/ML  SOLN
100.0000 mL | Freq: Once | INTRAMUSCULAR | Status: AC | PRN
  Administered 2019-08-19: 100 mL via INTRAVENOUS

## 2019-08-19 NOTE — H&P (Signed)
History and Physical    Katrina Lara YTK:[id number] DOB: 10-24-1955 DOA: 08/19/2019  PCP: Rusty Aus, MD (Confirm with patient/family/NH records and if not entered, this has to be entered at [facility] point of entry) Patient coming from: [place] office  I have personally briefly reviewed patient's old medical records in [place]  Chief Complaint: abdominal pain  HPI: Katrina Lara is a 65 y.o. female with medical history significant of HTN, hypothyroidism  who presents with lower abdominal pain over the last week, mainly in the suprapubic region and left lower quadrant, coming in waves but constant, and associated with nausea but no vomiting.  The patient also has had decreased bowel movements, and no diarrhea.  She denies associated urinary symptoms.  She has had some chills.  The symptoms initially started after she ate some [place] food.  She was seen in the ED last week for this abdominal pain and had a CT that was negative, but the symptoms worsen.  She was then seen in the outpatient clinic today because of persistent pain, fever, chills, sweats and was referred to the ED for further evaluation.  ED Course: Patient is afebrile and hemodynamically stable. Chemistry panel and CBC were normal. Lactic acid 1.8. CT abd/pelvis reveals thrombosis of the main, right and left portal veins. No other thrombosed veins noted. No evidence of ischemic bowel, no evidence of appendicitis. Full dose IV Heparin started. TR[facility] called to admit for mangement and further evaluation.   Review of Systems: As per HPI otherwise 10 point review of systems negative.    Past Medical History:  Diagnosis Date  . Hypertension   . Thyroid disease     Past Surgical History:  Procedure Laterality Date  . ABDOMINAL HYSTERECTOMY     Soc Hx -  Married 40 years. One son, one daughter, 2 grand-daughters 49, 70. She is reitred. She lives with her husband and 76 year old grand daughter.   reports that she has never  smoked. She has never used smokeless tobacco. No history on file for alcohol and drug.  No Known Allergies  Family History  Problem Relation Age of Onset  . Cardiomyopathy Mother   . Hypertension Mother   . Hypertension Father   . Breast cancer Neg Hx      Prior to Admission medications   Medication Sig Start Date End Date Taking? Authorizing Provider  dicyclomine (BENTYL) 20 MG tablet Take 1 tablet (20 mg total) by mouth 4 (four) times daily -  before meals and at bedtime for 5 days. 08/11/19 08/19/19 Yes Duffy Bruce, MD  hydrochlorothiazide (HYDRODIURIL) 50 MG tablet Take 50 mg by mouth daily. 06/08/19  Yes [provider]  levothyroxine (SYNTHROID) 125 MCG tablet Take 125 mcg by mouth daily. 06/08/19  Yes [provider]  potassium chloride (KLOR-CON) 10 MEQ tablet Take 10 mEq by mouth 3 (three) times daily. 05/25/19  Yes [provider]  promethazine (PHENERGAN) 25 MG tablet Take 1 tablet (25 mg total) by mouth every 8 (eight) hours as needed for nausea or vomiting. 08/11/19  Yes Duffy Bruce, MD  spironolactone (ALDACTONE) 25 MG tablet Take 25 mg by mouth daily. 05/25/19  Yes [provider]    Physical Exam: Vitals:   08/19/19 1900 08/19/19 1930 08/19/19 2000 08/19/19 2200  BP: (!) 126/59 126/65 (!) 116/58   Pulse: 80 74 73 83  Resp: 20 19 20 20   Temp:      TempSrc:  SpO2: 99% 99% 97% 98%  Weight:      Height:        Constitutional: NAD, calm, comfortable Vitals:   08/19/19 1900 08/19/19 1930 08/19/19 2000 08/19/19 2200  BP: (!) 126/59 126/65 (!) 116/58   Pulse: 80 74 73 83  Resp: 20 19 20 20   Temp:      TempSrc:      SpO2: 99% 99% 97% 98%  Weight:      Height:       General:  Overweight woman who is uncomfortable 2/2 LLQ abdominal pain. Eyes: PERRL, lids and conjunctivae normal w/o scleral icterus ENMT: Mucous membranes are moist. Posterior pharynx clear of any exudate or lesions.Normal dentition.  Neck: normal, supple, no  masses, no thyromegaly Respiratory: clear to auscultation bilaterally, no wheezing, no crackles. Normal respiratory effort. No accessory muscle use.  Cardiovascular: Regular rate and rhythm, no murmurs / rubs / gallops. No extremity edema. 2+ pedal pulses. No carotid bruits.  Abdomen: obese, LLQ tenderness to palpation, soft, no guarding or rebound. no masses palpated.  Bowel sounds positive.  Musculoskeletal: no clubbing / cyanosis. No joint deformity upper and lower extremities. Good ROM, no contractures. Normal muscle tone.  Skin: no rashes, lesions, ulcers. No induration Neurologic: CN 2-12 grossly intact. Sensation intact,. Strength 5/5 in all 4.  Psychiatric: Normal judgment and insight. Alert and oriented x 3. Anxious mood.     Labs on Admission: I have personally reviewed following labs and imaging studies  CBC: Recent Labs  Lab 08/19/19 1708  WBC 8.8  NEUTROABS 5.7  HGB 12.7  HCT 40.1  MCV 92.0  PLT 316   Basic Metabolic Panel: Recent Labs  Lab 08/19/19 1708  NA 132*  K 3.4*  CL 98  CO2 24  GLUCOSE 107*  BUN 15  CREATININE 1.18*  CALCIUM 9.2   GFR: Estimated Creatinine Clearance: 63.1 mL/min (A) (by C-G formula based on SCr of 1.18 mg/dL (H)). Liver Function Tests: Recent Labs  Lab 08/19/19 1708  AST 43*  ALT 42  ALKPHOS 93  BILITOT 0.8  PROT 9.3*  ALBUMIN 3.5   Recent Labs  Lab 08/19/19 1708  LIPASE 30   No results for input(s): AMMONIA in the last 168 hours. Coagulation Profile: Recent Labs  Lab 08/19/19 1708  INR 1.1   Cardiac Enzymes: No results for input(s): CKTOTAL, CKMB, CKMBINDEX, TROPONINI in the last 168 hours. BNP (last 3 results) No results for input(s): PROBNP in the last 8760 hours. HbA1C: No results for input(s): HGBA1C in the last 72 hours. CBG: No results for input(s): GLUCAP in the last 168 hours. Lipid Profile: No results for input(s): CHOL, HDL, LDLCALC, TRIG, CHOLHDL, LDLDIRECT in the last 72 hours. Thyroid Function  Tests: No results for input(s): TSH, T4TOTAL, FREET4, T3FREE, THYROIDAB in the last 72 hours. Anemia Panel: No results for input(s): VITAMINB12, FOLATE, FERRITIN, TIBC, IRON, RETICCTPCT in the last 72 hours. Urine analysis:    Component Value Date/Time   COLORURINE YELLOW (A) 08/19/2019 1856   APPEARANCEUR CLEAR (A) 08/19/2019 1856   LABSPEC 1.017 08/19/2019 1856   PHURINE 6.0 08/19/2019 1856   GLUCOSEU NEGATIVE 08/19/2019 1856   HGBUR NEGATIVE 08/19/2019 1856   BILIRUBINUR NEGATIVE 08/19/2019 1856   KETONESUR NEGATIVE 08/19/2019 1856   PROTEINUR NEGATIVE 08/19/2019 1856   NITRITE NEGATIVE 08/19/2019 1856   LEUKOCYTESUR NEGATIVE 08/19/2019 1856    Radiological Exams on Admission: CT ABDOMEN PELVIS W CONTRAST  Result Date: 08/19/2019 CLINICAL DATA:  Acute left lower quadrant  abdominal pain. EXAM: CT ABDOMEN AND PELVIS WITH CONTRAST TECHNIQUE: Multidetector CT imaging of the abdomen and pelvis was performed using the standard protocol following bolus administration of intravenous contrast. CONTRAST:  OMNIPAQUE IOHEXOL 300 MG/ML  SOLN COMPARISON:  August 11, 2019. FINDINGS: Lower chest: No acute abnormality. Hepatobiliary: No gallstones or biliary dilatation is noted. No focal parenchymal abnormality is noted in the liver. However, there appears to be thrombosis of the main, left and right portal veins. The splenic and superior mesenteric veins are patent. Pancreas: Unremarkable. No pancreatic ductal dilatation or surrounding inflammatory changes. Spleen: Normal in size without focal abnormality. Adrenals/Urinary Tract: Adrenal glands are unremarkable. Kidneys are normal, without renal calculi, focal lesion, or hydronephrosis. Bladder is unremarkable. Stomach/Bowel: Stomach is within normal limits. Appendix appears normal. No evidence of bowel wall thickening, distention, or inflammatory changes. Vascular/Lymphatic: As noted above, there appears to be acute thrombosis of the main, left and  right portal veins. Abdominal aorta is unremarkable. No adenopathy is noted. Reproductive: Status post hysterectomy. No adnexal masses. Other: Small fat containing right inguinal hernia is noted. No ascites is noted. Musculoskeletal: No acute or significant osseous findings. IMPRESSION: Findings consistent with acute thrombosis of the main, left and right portal veins. Critical Value/emergent results were called by telephone at the time of interpretation on 08/19/2019 at 7:04 pm to provider Dr. Roxan Hockey, who verbally acknowledged these results. Electronically Signed   By: Lupita Raider M.D.   On: 08/19/2019 19:04   DG Chest Portable 1 View  Result Date: 08/19/2019 CLINICAL DATA:  Shortness of breath EXAM: PORTABLE CHEST 1 VIEW COMPARISON:  10/04/2008 FINDINGS: Linear scarring or atelectasis in the right upper lung. No consolidation or effusion. Normal heart size. No pneumothorax. IMPRESSION: No active disease. Electronically Signed   By: Jasmine Pang M.D.   On: 08/19/2019 17:48    EKG: Independently reviewed. NSR normal EKG  Assessment/Plan Active Problems:   PVT (portal vein thrombosis)   HTN (hypertension)   Hypothyroidism  (please populate well all problems here in Problem List. (For example, if patient is on BP meds at home and you resume or decide to hold them, it is a problem that needs to be her. Same for CAD, COPD, HLD and so on)   1. Portal vein thrombosis - patient w/o predisposing medical condition, prior h/o blood clots. Reports normal pregnancies and deliveries. Not a drinker. Plan Med-surg admit  Full dose Heparin anticoagulation  Ketorolac 30 mg IV q 6 for pain  PPI  Coag w/u: AT III, Protein C, S, Lieden V  Once workup complete can switch to NOAC for full 3-6 months tx.  2. HTN- continue home meds  3. Hypothyroidism - chronic problem Plan TSH  Continue home meds  DVT prophylaxis: full dose heparin - pharmacy following  Code Status: full code  Family Communication:  Spoke with Verne Grain, husband. Reviewed Dx and Tx plan. Answered all questions. Disposition Plan: Home in 24-48 hours  Consults called: Dr. Wyn Quaker for vascular surgery was called by EDP - no surgical issues.  Admission status: inpatient    Illene Regulus MD [facility] Pager [phone]  If 7PM-7AM, please contact night-coverage [email or web address] Password [email or web address]  08/19/2019, 10:10 PM

## 2019-08-19 NOTE — ED Triage Notes (Signed)
Pt presents to ED referred by PCP with the following note: "Acute appendicitis with peritonitis-patient looks septic and with progressive symptoms with tachycardia, tachypnea, needs emergent attention. Appears to be a surgical emergency, transferred to the ED."   Pt c/o severe pain to LLQ, is panting in triage with pain waves. 1 set of blood cultures sent with rainbow and lactic acid.

## 2019-08-19 NOTE — Consult Note (Signed)
ANTICOAGULATION CONSULT NOTE - Follow Up Consult  Pharmacy Consult for Heparin  Indication: VTE treatment   No Known Allergies  Patient Measurements: Height: 5\' 5"  (165.1 cm) Weight: 113.9 kg (251 lb) IBW/kg (Calculated) : 57 Heparin Dosing Weight: 84 kg   Vital Signs: Temp: 97.6 F (36.4 C) (05/05 1651) Temp Source: Oral (05/05 1651) BP: 126/59 (05/05 1900) Pulse Rate: 80 (05/05 1900)  Labs: Recent Labs    08/19/19 1708  HGB 12.7  HCT 40.1  PLT 316  CREATININE 1.18*    Estimated Creatinine Clearance: 63.1 mL/min (A) (by C-G formula based on SCr of 1.18 mg/dL (H)).   Medications:  No prior anticoagulants - confirmed with patient.   Assessment: Pharmacy has been consulted for heparin dosing in this patient who was referred by her PCP for "acute appendicitis with peritonitis". Baseline H/H and platelets WNL.   Goal of Therapy:  Heparin level 0.3-0.7 units/ml Monitor platelets by anticoagulation protocol: Yes   Plan:  Baseline labs have been ordered  Heparin DW: 84 kg  Give 4200 units bolus x 1 Start heparin infusion at 1300 units/hr Check anti-Xa level in 6 hours and daily while on heparin, per protocol Continue to monitor H&H and platelets    Katrina Lara R Steffen Hase 08/19/2019,7:35 PM

## 2019-08-19 NOTE — ED Notes (Signed)
CT notified that patient done with drinking contrast.

## 2019-08-19 NOTE — ED Provider Notes (Signed)
[facility] Emergency Department Provider Note ____________________________________________   First MD Initiated Contact with Patient 08/19/19 1715     (approximate)  I have reviewed the triage vital signs and the nursing notes.   HISTORY  Chief Complaint No chief complaint on file.    HPI Katrina Lara is a 64 y.o. female with PMH as noted below who presents with lower abdominal pain over the last week, mainly in the suprapubic region and left lower quadrant, coming in waves but constant, and associated with nausea but no vomiting.  The patient also has had decreased bowel movements, and no diarrhea.  She denies associated urinary symptoms.  She has had some chills.  The symptoms initially started after she ate some [place] food.  She was seen in the ED last week and had a CT that was negative, but the symptoms worsen.  She was then seen in the outpatient clinic today and referred to the ED for further evaluation.  Past Medical History:  Diagnosis Date  . Hypertension   . Thyroid disease     There are no problems to display for this patient.   Past Surgical History:  Procedure Laterality Date  . ABDOMINAL HYSTERECTOMY      Prior to Admission medications   Medication Sig Start Date End Date Taking? Authorizing Provider  dicyclomine (BENTYL) 20 MG tablet Take 1 tablet (20 mg total) by mouth 4 (four) times daily -  before meals and at bedtime for 5 days. 08/11/19 08/16/19  Shaune Pollack, MD  hydrochlorothiazide (HYDRODIURIL) 50 MG tablet Take 50 mg by mouth daily. 06/08/19   [provider]  levothyroxine (SYNTHROID) 125 MCG tablet Take 125 mcg by mouth daily. 06/08/19   [provider]  promethazine (PHENERGAN) 25 MG tablet Take 1 tablet (25 mg total) by mouth every 8 (eight) hours as needed for nausea or vomiting. 08/11/19   Shaune Pollack, MD  spironolactone (ALDACTONE) 25 MG tablet Take 25 mg by mouth daily. 05/25/19   [provider]    Allergies Patient has no known allergies.  Family History  Problem Relation Age of Onset  . Breast cancer Neg Hx     Social History Social History   Tobacco Use  . Smoking status: Never Smoker  . Smokeless tobacco: Never Used  Substance Use Topics  . Alcohol use: Not on file  . Drug use: Not on file    Review of Systems  Constitutional: Positive for chills. Eyes: No redness. ENT: No sore throat. Cardiovascular: Denies chest pain. Respiratory: Positive for shortness of breath. Gastrointestinal: Positive for nausea.  No vomiting or diarrhea.  Genitourinary: Negative for dysuria or hematuria.  Musculoskeletal: Negative for back pain. Skin: Negative for rash. Neurological: Negative for headache.   ____________________________________________   PHYSICAL EXAM:  VITAL SIGNS: ED Triage Vitals [08/19/19 1651]  Enc Vitals Group     BP (!) 137/53     Pulse Rate (!) 102     Resp 20     Temp 97.6 F (36.4 C)     Temp Source Oral     SpO2 98 %     Weight 251 lb (113.9 kg)     Height 5\' 5"  (1.651 m)     Head Circumference      Peak Flow      Pain Score      Pain Loc      Pain Edu?      Excl. in GC?  Constitutional: Alert and oriented.  Uncomfortable appearing but in no acute distress. Eyes: Conjunctivae are normal.  No scleral icterus. Head: Atraumatic. Nose: No congestion/rhinnorhea. Mouth/Throat: Mucous membranes are moist.   Neck: Normal range of motion.  Cardiovascular: Normal rate, regular rhythm.Good peripheral circulation. Respiratory: Intermittently tachypneic when she has waves of pain.  Lungs clear bilaterally. Gastrointestinal: Soft with moderate left lower quadrant and midline suprapubic tenderness.  No distention.  Genitourinary: No CVA tenderness. Musculoskeletal: No lower extremity edema.  Extremities warm and well perfused.  Neurologic:  Normal speech and language. No gross focal neurologic deficits are appreciated.  Skin:   Skin is warm and dry. No rash noted. Psychiatric: Anxious appearing.  ____________________________________________   LABS (all labs ordered are listed, but only abnormal results are displayed)  Labs Reviewed  COMPREHENSIVE METABOLIC PANEL - Abnormal; Notable for the following components:      Result Value   Sodium 132 (*)    Potassium 3.4 (*)    Glucose, Bld 107 (*)    Creatinine, Ser 1.18 (*)    Total Protein 9.3 (*)    AST 43 (*)    GFR calc non Af Amer 50 (*)    GFR calc Af Amer 58 (*)    All other components within normal limits  URINALYSIS, COMPLETE (UACMP) WITH MICROSCOPIC - Abnormal; Notable for the following components:   Color, Urine YELLOW (*)    APPearance CLEAR (*)    Bacteria, UA RARE (*)    All other components within normal limits  RESPIRATORY PANEL BY RT PCR (FLU A&B, COVID)  LACTIC ACID, PLASMA  CBC WITH DIFFERENTIAL/PLATELET  LIPASE, BLOOD  APTT  PROTIME-INR  HEPARIN LEVEL (UNFRACTIONATED)  CBC   ____________________________________________  EKG  ED ECG REPORT I, Arta Silence, the attending physician, personally viewed and interpreted this ECG.  Date: 08/19/2019 EKG Time: 1745 Rate: 80 Rhythm: normal sinus rhythm QRS Axis: normal Intervals: normal ST/T Wave abnormalities: normal Narrative Interpretation: no evidence of acute ischemia  ____________________________________________  RADIOLOGY  CXR: No focal infiltrate or other acute abnormality CT abdomen: Portal vein thrombosis  ____________________________________________   PROCEDURES  Procedure(s) performed: No  Procedures  Critical Care performed: No ____________________________________________   INITIAL IMPRESSION / ASSESSMENT AND PLAN / ED COURSE  Pertinent labs & imaging results that were available during my care of the patient were reviewed by me and considered in my medical decision making (see chart for details).  64 year old female with PMH as noted above  presents from the outpatient clinic with abdominal pain over at least the last week.  I reviewed the past medical records in epic and [place].  She was seen in the ED on 4/27 with left lower quadrant abdominal pain.  She had a slightly elevated WBC count but a negative CT and was discharged with improved symptoms.  She was seen in the outpatient clinic today, where the physician noted:  ASSESSMENT/PLAN:  Acute appendicitis with peritonitis-patient looks septic and with progressive symptoms with tachycardia, tachypnea, needs emergent attention. Appears to be a surgical emergency, transferred to the ED   However, the patient has not had additional imaging today and has no confirmed diagnosis of appendicitis.  On exam she is tender in the left lower quadrant with a soft abdomen and no evidence of acute peritonitis.  She is borderline tachycardic with otherwise normal vital signs, although she does report some shortness of breath and has tachypnea when she has a wave of the pain.  At this time the patient  does not have SIRS criteria or evidence of sepsis.  Differential primarily includes diverticulitis, colitis, UTI/cystitis, ureteral stone, volvulus, or less likely acute appendicitis.  We will obtain lab work-up, urinalysis, CT and reassess.  ----------------------------------------- 8:09 PM on 08/19/2019 -----------------------------------------  CT shows portal vein thrombosis but no other acute abnormality.  The etiology is unclear as the patient has no history of liver disease.  I consulted Dr. Wyn Quaker from vascular surgery who recommended anticoagulation with heparin if the patient was to be admitted.  Given the relatively severe pain that the patient was in, I feel she is more appropriate for inpatient monitoring and further work-up.  I started the patient on a heparin infusion.  I discussed her case with the hospitalist Dr. Debby Bud for  admission.  ____________________________________________   FINAL CLINICAL IMPRESSION(S) / ED DIAGNOSES  Final diagnoses:  Portal vein thrombosis      NEW MEDICATIONS STARTED DURING THIS VISIT:  New Prescriptions   No medications on file     Note:  This document was prepared using Dragon voice recognition software and may include unintentional dictation errors.    Dionne Bucy, MD 08/19/19 2010

## 2019-08-19 NOTE — ED Notes (Signed)
Pt assisted to the bathroom by this RN. UA obtained by this RN. Pt tolerated well.

## 2019-08-19 NOTE — ED Notes (Signed)
Pt transported to CT at this time.

## 2019-08-20 ENCOUNTER — Encounter: Payer: Self-pay | Admitting: Internal Medicine

## 2019-08-20 DIAGNOSIS — E039 Hypothyroidism, unspecified: Secondary | ICD-10-CM

## 2019-08-20 DIAGNOSIS — I81 Portal vein thrombosis: Principal | ICD-10-CM

## 2019-08-20 DIAGNOSIS — I10 Essential (primary) hypertension: Secondary | ICD-10-CM

## 2019-08-20 LAB — CBC
HCT: 35.6 % — ABNORMAL LOW (ref 36.0–46.0)
Hemoglobin: 11.8 g/dL — ABNORMAL LOW (ref 12.0–15.0)
MCH: 30.5 pg (ref 26.0–34.0)
MCHC: 33.1 g/dL (ref 30.0–36.0)
MCV: 92 fL (ref 80.0–100.0)
Platelets: 247 10*3/uL (ref 150–400)
RBC: 3.87 MIL/uL (ref 3.87–5.11)
RDW: 13.2 % (ref 11.5–15.5)
WBC: 9.9 10*3/uL (ref 4.0–10.5)
nRBC: 0 % (ref 0.0–0.2)

## 2019-08-20 LAB — TSH: TSH: 0.416 u[IU]/mL (ref 0.350–4.500)

## 2019-08-20 LAB — ANTITHROMBIN III: AntiThromb III Func: 128 % — ABNORMAL HIGH (ref 75–120)

## 2019-08-20 LAB — HEPARIN LEVEL (UNFRACTIONATED)
Heparin Unfractionated: 0.65 IU/mL (ref 0.30–0.70)
Heparin Unfractionated: 0.5 IU/mL (ref 0.30–0.70)

## 2019-08-20 LAB — HIV ANTIBODY (ROUTINE TESTING W REFLEX): HIV Screen 4th Generation wRfx: NONREACTIVE

## 2019-08-20 MED ORDER — ENSURE ENLIVE PO LIQD: 237.0000 mL | Freq: Two times a day (BID) | ORAL | Status: DC

## 2019-08-20 MED ORDER — APIXABAN 5 MG PO TABS: 10.0000 mg | ORAL_TABLET | Freq: Two times a day (BID) | ORAL | 3 refills | Status: DC

## 2019-08-20 MED ORDER — APIXABAN 5 MG PO TABS
10.0000 mg | ORAL_TABLET | Freq: Two times a day (BID) | ORAL | Status: DC
  Administered 2019-08-20: 10 mg via ORAL
  Filled 2019-08-20 (×2): qty 2

## 2019-08-20 MED ORDER — APIXABAN 5 MG PO TABS: 5.0000 mg | ORAL_TABLET | Freq: Two times a day (BID) | ORAL | Status: DC

## 2019-08-20 NOTE — Discharge Planning (Signed)
Patient IVs and tele removed.  RN assessment and VS revealed stability for DC to home with husband.  Discharge papers given, explained and educated.  Informed of suggested FU and also arranged for [facility].  Once ready, will be wheeled to front and husband transporting home via car.

## 2019-08-20 NOTE — TOC Transition Note (Signed)
Transition of Care [facility]) - CM/SW Discharge Note   Patient Details  Name: Katrina Lara MRN: [id number] Date of Birth: [birth year]  [facility]) CM/SW Contact:  Su Hilt, RN Phone Number: 08/20/2019, 3:40 PM   Clinical Narrative:     Met with the patient and her husband in the room, Provided her with the [facility], I called [facility] and they do have Eliquiis, The physician is sending over to [facility] electronically, I explained to the husband where [facility] was and that he will need to pick it up before 430 when they close, The husband was taken to the entrance by the nurse in a wheelchair so he would be able to go to [facility] before they close.  The patient does have a PCP and will continue to see them, She has no other needs        Patient Goals and CMS Choice        Discharge Placement                       Discharge Plan and Services                                     Social Determinants of Health (SDOH) Interventions     Readmission Risk Interventions No flowsheet data found.

## 2019-08-20 NOTE — ED Notes (Signed)
Pt taken to floor room with RN. Pt on stretcher, with monitor.

## 2019-08-20 NOTE — Consult Note (Signed)
[facility] Vascular Consult Note  MRN : [id number]  Katrina Lara is a 64 y.o. (11-Jun-1955) female who presents with chief complaint of abdominal pain.  History of Present Illness:  The patient is a 64 year old a past medical history of hypertension who presented to the [facility] emergency department with a chief complaint of abdominal pain.   Patient endorses a history of progressively worsening abdominal pain for approximately one week. Pain is intermittent located to the epigastric area worse after eating.  States some nausea however no vomiting.  Denies any fever.  During the patient's work-up in our emergency department she underwent a CTA and was found to have: Findings consistent with acute thrombosis of the main, left and right portal veins.  The patient was initiated on heparin and admitted to the hospital for further care.  Vascular surgery was consulted by Dr. Posey Pronto for hepatic vein thrombosis.  Current Facility-Administered Medications  Medication Dose Route Frequency Provider Last Rate Last Admin  . apixaban (ELIQUIS) tablet 10 mg  10 mg Oral BID Fritzi Mandes, MD       Followed by  . [START ON 08/27/2019] apixaban (ELIQUIS) tablet 5 mg  5 mg Oral BID Fritzi Mandes, MD      . dicyclomine (BENTYL) tablet 20 mg  20 mg Oral TID AC & HS Norins, Heinz Knuckles, MD   20 mg at 08/20/19 1223  . hydrochlorothiazide (HYDRODIURIL) tablet 50 mg  50 mg Oral Daily Norins, Heinz Knuckles, MD      . ketorolac (TORADOL) 30 MG/ML injection 30 mg  30 mg Intravenous Q6H PRN Norins, Heinz Knuckles, MD   30 mg at 08/20/19 1450  . levothyroxine (SYNTHROID) tablet 125 mcg  125 mcg Oral Daily Neena Rhymes, MD   125 mcg at 08/20/19 0522  . pantoprazole (PROTONIX) EC tablet 40 mg  40 mg Oral Daily Norins, Heinz Knuckles, MD   40 mg at 08/20/19 1024  . potassium chloride SA (KLOR-CON) CR tablet 10 mEq  10 mEq Oral TID Neena Rhymes, MD   10 mEq at 08/20/19 1024  .  promethazine (PHENERGAN) tablet 25 mg  25 mg Oral Q8H PRN Norins, Heinz Knuckles, MD      . senna (SENOKOT) tablet 8.6 mg  1 tablet Oral BID Norins, Heinz Knuckles, MD   8.6 mg at 08/20/19 1024  . spironolactone (ALDACTONE) tablet 25 mg  25 mg Oral Daily Norins, Heinz Knuckles, MD   25 mg at 08/20/19 1024   Past Medical History:  Diagnosis Date  . Hypertension   . Thyroid disease    Past Surgical History:  Procedure Laterality Date  . ABDOMINAL HYSTERECTOMY     Social History Social History   Tobacco Use  . Smoking status: Never Smoker  . Smokeless tobacco: Never Used  Substance Use Topics  . Alcohol use: Not on file  . Drug use: Not on file   Family History Family History  Problem Relation Age of Onset  . Cardiomyopathy Mother   . Hypertension Mother   . Hypertension Father   . Hypertension Sister   . Breast cancer Neg Hx   Denies family history of peripheral artery disease, venous disease or renal disease.  No Known Allergies  REVIEW OF SYSTEMS (Negative unless checked)  Constitutional: [] Weight loss  [] Fever  [] Chills Cardiac: [] Chest pain   [] Chest pressure   [] Palpitations   [] Shortness of breath when laying flat   [] Shortness of breath at rest   []   Shortness of breath with exertion. Vascular:  [] Pain in legs with walking   [] Pain in legs at rest   [] Pain in legs when laying flat   [] Claudication   [] Pain in feet when walking  [] Pain in feet at rest  [] Pain in feet when laying flat   [] History of DVT   [] Phlebitis   [] Swelling in legs   [] Varicose veins   [] Non-healing ulcers Pulmonary:   [] Uses home oxygen   [] Productive cough   [] Hemoptysis   [] Wheeze  [] COPD   [] Asthma Neurologic:  [] Dizziness  [] Blackouts   [] Seizures   [] History of stroke   [] History of TIA  [] Aphasia   [] Temporary blindness   [] Dysphagia   [] Weakness or numbness in arms   [] Weakness or numbness in legs Musculoskeletal:  [] Arthritis   [] Joint swelling   [] Joint pain   [] Low back pain Hematologic:  [] Easy bruising   [] Easy bleeding   [] Hypercoagulable state   [] Anemic  [] Hepatitis Gastrointestinal:  [] Blood in stool   [] Vomiting blood  [] Gastroesophageal reflux/heartburn   [] Difficulty swallowing. Genitourinary:  [] Chronic kidney disease   [] Difficult urination  [] Frequent urination  [] Burning with urination   [] Blood in urine Skin:  [] Rashes   [] Ulcers   [] Wounds Psychological:  [] History of anxiety   []  History of major depression.  Positive for abdominal pain and nausea.  Physical Examination  Vitals:   08/20/19 0115 08/20/19 0130 08/20/19 0215 08/20/19 0813  BP:  (!) 115/58 128/66 111/62  Pulse: 66 67 66 67  Resp: (!) 21 (!) 22  16  Temp:   98.1 F (36.7 C) 98 F (36.7 C)  TempSrc:   Oral Oral  SpO2: 98% 99% 100% 96%  Weight:      Height:       Body mass index is 41.77 kg/m. Gen:  WD/WN, NAD Head: [place]/AT, No temporalis wasting. Prominent temp pulse not noted. Ear/Nose/Throat: Hearing grossly intact, nares w/o erythema or drainage, oropharynx w/o Erythema/Exudate Eyes: Sclera non-icteric, conjunctiva clear Neck: Trachea midline.  No JVD.  Pulmonary:  Good air movement, respirations not labored, equal bilaterally.  Cardiac: RRR, normal S1, S2. Vascular:  Vessel Right Left  Radial Palpable Palpable  Ulnar Palpable Palpable  Brachial Palpable Palpable  Carotid Palpable, without bruit Palpable, without bruit  Aorta Not palpable N/A  Femoral Palpable Palpable  Popliteal Palpable Palpable  PT Palpable Palpable  DP Palpable Palpable   Gastrointestinal: soft, non-tender/non-distended. No guarding/reflex.  Musculoskeletal: M/S 5/5 throughout.  Extremities without ischemic changes.  No deformity or atrophy. No edema. Neurologic: Sensation grossly intact in extremities.  Symmetrical.  Speech is fluent. Motor exam as listed above. Psychiatric: Judgment intact, Mood & affect appropriate for pt's clinical situation. Dermatologic: No rashes or ulcers noted.  No cellulitis or open  wounds. Lymph : No Cervical, Axillary, or Inguinal lymphadenopathy.  CBC Lab Results  Component Value Date   WBC 9.9 08/20/2019   HGB 11.8 (L) 08/20/2019   HCT 35.6 (L) 08/20/2019   MCV 92.0 08/20/2019   PLT 247 08/20/2019   BMET    Component Value Date/Time   NA 132 (L) 08/19/2019 1708   K 3.4 (L) 08/19/2019 1708   CL 98 08/19/2019 1708   CO2 24 08/19/2019 1708   GLUCOSE 107 (H) 08/19/2019 1708   BUN 15 08/19/2019 1708   CREATININE 1.18 (H) 08/19/2019 1708   CALCIUM 9.2 08/19/2019 1708   GFRNONAA 50 (L) 08/19/2019 1708   GFRAA 58 (L) 08/19/2019 1708   Estimated Creatinine Clearance: 63.1  mL/min (A) (by C-G formula based on SCr of 1.18 mg/dL (H)).  COAG Lab Results  Component Value Date   INR 1.1 08/19/2019   Radiology CT ABDOMEN PELVIS W CONTRAST  Result Date: 08/19/2019 CLINICAL DATA:  Acute left lower quadrant abdominal pain. EXAM: CT ABDOMEN AND PELVIS WITH CONTRAST TECHNIQUE: Multidetector CT imaging of the abdomen and pelvis was performed using the standard protocol following bolus administration of intravenous contrast. CONTRAST:  OMNIPAQUE IOHEXOL 300 MG/ML  SOLN COMPARISON:  August 11, 2019. FINDINGS: Lower chest: No acute abnormality. Hepatobiliary: No gallstones or biliary dilatation is noted. No focal parenchymal abnormality is noted in the liver. However, there appears to be thrombosis of the main, left and right portal veins. The splenic and superior mesenteric veins are patent. Pancreas: Unremarkable. No pancreatic ductal dilatation or surrounding inflammatory changes. Spleen: Normal in size without focal abnormality. Adrenals/Urinary Tract: Adrenal glands are unremarkable. Kidneys are normal, without renal calculi, focal lesion, or hydronephrosis. Bladder is unremarkable. Stomach/Bowel: Stomach is within normal limits. Appendix appears normal. No evidence of bowel wall thickening, distention, or inflammatory changes. Vascular/Lymphatic: As noted above, there  appears to be acute thrombosis of the main, left and right portal veins. Abdominal aorta is unremarkable. No adenopathy is noted. Reproductive: Status post hysterectomy. No adnexal masses. Other: Small fat containing right inguinal hernia is noted. No ascites is noted. Musculoskeletal: No acute or significant osseous findings. IMPRESSION: Findings consistent with acute thrombosis of the main, left and right portal veins. Critical Value/emergent results were called by telephone at the time of interpretation on 08/19/2019 at 7:04 pm to provider Dr. Roxan Hockey, who verbally acknowledged these results. Electronically Signed   By: Lupita Raider M.D.   On: 08/19/2019 19:04   CT ABDOMEN PELVIS W CONTRAST  Result Date: 08/11/2019 CLINICAL DATA:  Abdominal pain EXAM: CT ABDOMEN AND PELVIS WITH CONTRAST TECHNIQUE: Multidetector CT imaging of the abdomen and pelvis was performed using the standard protocol following bolus administration of intravenous contrast. CONTRAST:  OMNIPAQUE IOHEXOL 300 MG/ML  SOLN COMPARISON:  None. FINDINGS: LOWER CHEST: Normal. HEPATOBILIARY: Normal hepatic contours. No intra- or extrahepatic biliary dilatation. The gallbladder is normal. PANCREAS: Normal pancreas. No ductal dilatation or peripancreatic fluid collection. SPLEEN: Normal. ADRENALS/URINARY TRACT: The adrenal glands are normal. No hydronephrosis, nephroureterolithiasis or solid renal mass. The urinary bladder is normal for degree of distention STOMACH/BOWEL: There is no hiatal hernia. Normal duodenal course and caliber. No small bowel dilatation or inflammation. No focal colonic abnormality. Normal appendix. VASCULAR/LYMPHATIC: Normal course and caliber of the major abdominal vessels. No abdominal or pelvic lymphadenopathy. REPRODUCTIVE: Status post hysterectomy. No adnexal mass. MUSCULOSKELETAL. No bony spinal canal stenosis or focal osseous abnormality. OTHER: None. IMPRESSION: No acute abnormality of the abdomen or pelvis.  Electronically Signed   By: Deatra Robinson M.D.   On: 08/11/2019 20:39   DG Chest Portable 1 View  Result Date: 08/19/2019 CLINICAL DATA:  Shortness of breath EXAM: PORTABLE CHEST 1 VIEW COMPARISON:  10/04/2008 FINDINGS: Linear scarring or atelectasis in the right upper lung. No consolidation or effusion. Normal heart size. No pneumothorax. IMPRESSION: No active disease. Electronically Signed   By: Jasmine Pang M.D.   On: 08/19/2019 17:48   Assessment/Plan The patient is a 64 year old female with a past medical history of hypertension hypothyroidism who presented to the [facility] emergency department with progressively worsening abdominal pain and nausea  1.  Portal vein thrombosis: During the patient's work-up for abdominal pain and nausea a CT  was notable for acute portal vein thrombosis.  Due to the location of the thrombosis endovascular intervention is not indicated.  Agree with the initiation of heparin.  Recommend transitioning to Eliquis 5 mg one tab PO twice daily for at least 6 months.  We will see the patient in our office in approximately 3 months to repeat a CT.  Okay to discharge home once Eliquis has started from a vascular standpoint.  Patient understands the importance of compliance with Eliquis / oral anticoagulation.  We discussed propagation if she were to stop oral anticoagulation.  2.  Hypertension: On appropriate medications. Encouraged good control as its slows the progression of atherosclerotic disease  3.  Hypothyroidism: Asymptomatic at this time. This is followed by the patient's primary care physician.  Discussed with Dr. Weldon Inches, PA-C  08/20/2019 3:39 PM  This note was created with Dragon medical transcription system.  Any error is purely unintentional

## 2019-08-20 NOTE — Progress Notes (Signed)
[facility] [place] NAME: Katrina Lara    MR#:  [id number]  DATE OF BIRTH:  [birth year]  SUBJECTIVE:  patient eating solid. Abdominal pain much improved. Denies any nausea vomiting.  REVIEW OF SYSTEMS:   Review of Systems  Constitutional: Negative for chills, fever and weight loss.  HENT: Negative for ear discharge, ear pain and nosebleeds.   Eyes: Negative for blurred vision, pain and discharge.  Respiratory: Negative for sputum production, shortness of breath, wheezing and stridor.   Cardiovascular: Negative for chest pain, palpitations, orthopnea and PND.  Gastrointestinal: Negative for abdominal pain, diarrhea, nausea and vomiting.  Genitourinary: Negative for frequency and urgency.  Musculoskeletal: Negative for back pain and joint pain.  Neurological: Negative for sensory change, speech change, focal weakness and weakness.  Psychiatric/Behavioral: Negative for depression and hallucinations. The patient is not nervous/anxious.    Tolerating Diet:yes Tolerating PT: ambulatory  DRUG ALLERGIES:  No Known Allergies  VITALS:  Blood pressure 111/62, pulse 67, temperature 98 F (36.7 C), temperature source Oral, resp. rate 16, height 5\' 5"  (1.651 m), weight 113.9 kg, SpO2 96 %.  PHYSICAL EXAMINATION:   Physical Exam  GENERAL:  64 y.o.-year-old patient lying in the bed with no acute distress. Obese EYES: Pupils equal, round, reactive to light and accommodation. No scleral icterus.   HEENT: Head atraumatic, normocephalic. Oropharynx and nasopharynx clear.  NECK:  Supple, no jugular venous distention. No thyroid enlargement, no tenderness.  LUNGS: Normal breath sounds bilaterally, no wheezing, rales, rhonchi. No use of accessory muscles of respiration.  CARDIOVASCULAR: S1, S2 normal. No murmurs, rubs, or gallops.  ABDOMEN: Soft, nontender, nondistended. Bowel sounds present. No organomegaly or mass.  EXTREMITIES: No cyanosis, clubbing  or edema b/l.    NEUROLOGIC: Cranial nerves II through XII are intact. No focal Motor or sensory deficits b/l.   PSYCHIATRIC:  patient is alert and oriented x 3.  SKIN: No obvious rash, lesion, or ulcer.   LABORATORY PANEL:  CBC Recent Labs  Lab 08/20/19 0255  WBC 9.9  HGB 11.8*  HCT 35.6*  PLT 247    Chemistries  Recent Labs  Lab 08/19/19 1708  NA 132*  K 3.4*  CL 98  CO2 24  GLUCOSE 107*  BUN 15  CREATININE 1.18*  CALCIUM 9.2  AST 43*  ALT 42  ALKPHOS 93  BILITOT 0.8   Cardiac Enzymes No results for input(s): TROPONINI in the last 168 hours. RADIOLOGY:  CT ABDOMEN PELVIS W CONTRAST  Result Date: 08/19/2019 CLINICAL DATA:  Acute left lower quadrant abdominal pain. EXAM: CT ABDOMEN AND PELVIS WITH CONTRAST TECHNIQUE: Multidetector CT imaging of the abdomen and pelvis was performed using the standard protocol following bolus administration of intravenous contrast. CONTRAST:  15mL OMNIPAQUE IOHEXOL 300 MG/ML  SOLN COMPARISON:  August 11, 2019. FINDINGS: Lower chest: No acute abnormality. Hepatobiliary: No gallstones or biliary dilatation is noted. No focal parenchymal abnormality is noted in the liver. However, there appears to be thrombosis of the main, left and right portal veins. The splenic and superior mesenteric veins are patent. Pancreas: Unremarkable. No pancreatic ductal dilatation or surrounding inflammatory changes. Spleen: Normal in size without focal abnormality. Adrenals/Urinary Tract: Adrenal glands are unremarkable. Kidneys are normal, without renal calculi, focal lesion, or hydronephrosis. Bladder is unremarkable. Stomach/Bowel: Stomach is within normal limits. Appendix appears normal. No evidence of bowel wall thickening, distention, or inflammatory changes. Vascular/Lymphatic: As noted above, there appears to be acute thrombosis of the main, left  and right portal veins. Abdominal aorta is unremarkable. No adenopathy is noted. Reproductive: Status post  hysterectomy. No adnexal masses. Other: Small fat containing right inguinal hernia is noted. No ascites is noted. Musculoskeletal: No acute or significant osseous findings. IMPRESSION: Findings consistent with acute thrombosis of the main, left and right portal veins. Critical Value/emergent results were called by telephone at the time of interpretation on 08/19/2019 at 7:04 pm to provider Dr. Roxan Hockey, who verbally acknowledged these results. Electronically Signed   By: Lupita Raider M.D.   On: 08/19/2019 19:04   DG Chest Portable 1 View  Result Date: 08/19/2019 CLINICAL DATA:  Shortness of breath EXAM: PORTABLE CHEST 1 VIEW COMPARISON:  10/04/2008 FINDINGS: Linear scarring or atelectasis in the right upper lung. No consolidation or effusion. Normal heart size. No pneumothorax. IMPRESSION: No active disease. Electronically Signed   By: Jasmine Pang M.D.   On: 08/19/2019 17:48   ASSESSMENT AND PLAN:  Katrina Lara is a 64 y.o. female with medical history significant of HTN, hypothyroidism who presents with lower abdominal pain over the last week, mainly in the suprapubic region and left lower quadrant, coming in waves but constant, and associated with nausea but no vomiting.  CT abd/pelvis reveals thrombosis of the main, right and left portal veins.  1. Portal vein thrombosis - patient w/o predisposing medical condition, prior h/o blood clots. Reports normal pregnancies and deliveries. Not a drinker.  -Full dose Heparin anticoagulation for now and  switch to NOAC for full 3-6 months tx -Vascular consult with Dr Dew--await [facility]  -Coag w/u: AT III, Protein C, S, Lieden V--sent out--results pending     2. HTN- continue home meds  3. Hypothyroidism - chronic problem  -Continue home meds  DVT prophylaxis: full dose heparin - pharmacy following  Code Status: full code  Family Communication: Spoke with Katrina Lara, husband. At bedside Disposition Plan: Home in 24-48 hours  Consults called: Dr. Wyn Quaker  for vascular surgery  - no surgical issues.  Admission status: inpatient  Status is: Inpatient  Remains inpatient appropriate because:Ongoing diagnostic testing needed not appropriate for outpatient work up   Dispo: The patient is from: Home              Anticipated d/c is to: Home              Anticipated d/c date is: 1 day              Patient currently is not medically stable to d/c.  TOTAL TIME TAKING CARE OF THIS PATIENT: 35 minutes.  >50% time spent on counselling and coordination of care  Note: This dictation was prepared with Dragon dictation along with smaller phrase technology. Any transcriptional errors that result from this process are unintentional.  Enedina Finner M.D    [facility]   CC: Primary care physician; Danella Penton, MDPatient ID: Katrina Lara, female   DOB: [birth year], 64 y.o.   MRN: [id number]

## 2019-08-20 NOTE — Consult Note (Signed)
ANTICOAGULATION CONSULT NOTE - Follow Up Consult  Pharmacy Consult for Heparin  Indication: VTE treatment   No Known Allergies  Patient Measurements: Height: 5\' 5"  (165.1 cm) Weight: 113.9 kg (251 lb) IBW/kg (Calculated) : 57 Heparin Dosing Weight: 84 kg   Vital Signs: Temp: 98.1 F (36.7 C) (05/06 0215) Temp Source: Oral (05/06 0215) BP: 128/66 (05/06 0215) Pulse Rate: 66 (05/06 0215)  Labs: Recent Labs    08/19/19 1708 08/20/19 0255  HGB 12.7 11.8*  HCT 40.1 35.6*  PLT 316 247  APTT 29  --   LABPROT 13.6  --   INR 1.1  --   HEPARINUNFRC  --  0.65  CREATININE 1.18*  --     Estimated Creatinine Clearance: 63.1 mL/min (A) (by C-G formula based on SCr of 1.18 mg/dL (H)).   Medications:  No prior anticoagulants - confirmed with patient.   Assessment: Pharmacy has been consulted for heparin dosing in this patient who was referred by her PCP for "acute appendicitis with peritonitis". Baseline H/H and platelets WNL.   Goal of Therapy:  Heparin level 0.3-0.7 units/ml Monitor platelets by anticoagulation protocol: Yes   Plan:  05/06 @ 0300 HL 0.65 therapeutic. Will continue current rate and will recheck HL at 0900, CBC stable will continue to monitor.  07/06, PharmD, BCPS Clinical Pharmacist 08/20/2019,6:02 AM

## 2019-08-20 NOTE — Progress Notes (Signed)
Initial Nutrition Assessment  DOCUMENTATION CODES:   Morbid obesity  INTERVENTION:  Provide Ensure Enlive po BID, each supplement provides 350 kcal and 20 grams of protein.  NUTRITION DIAGNOSIS:   Inadequate oral intake related to decreased appetite as evidenced by per patient/family report.  GOAL:   Patient will meet greater than or equal to 90% of their needs  MONITOR:   PO intake, Supplement acceptance, Labs, Weight trends, I & O's  REASON FOR ASSESSMENT:   Malnutrition Screening Tool    ASSESSMENT:   64 year old female with PMHx of HTN, hypothyroidism admitted with portal vein thrombosis.   Met with patient and her husband at bedside. She reports for the past week she has had decreased appetite and intake. She reports a throbbing abdominal pain after eating. Today she was able to eat solid food but intake is still lower than usual. Patient is amenable to trying Ensure. Discussed importance of adequate protein intake.  Patient reports she has lost approximately 20 lbs recently. According to chart patient was 123.4 kg on 06/02/2019. She is now documented to be 113.9 kg (251 lbs). She has lost 9.5 kg (7.7% body weight) over the past 3 months, which is significant for time frame.  Medications reviewed and include: Protonix, potassium chloride 10 mEq TID, senna 1 tablet BID.  Labs reviewed: Sodium 132, Potassium 3.4, Creatinine 1.18.  Patient does not meet criteria for malnutrition at this time.  NUTRITION - FOCUSED PHYSICAL EXAM:    Most Recent Value  Orbital Region  No depletion  Upper Arm Region  No depletion  Thoracic and Lumbar Region  No depletion  Buccal Region  No depletion  Temple Region  No depletion  Clavicle Bone Region  No depletion  Clavicle and Acromion Bone Region  No depletion  Scapular Bone Region  No depletion  Dorsal Hand  No depletion  Patellar Region  No depletion  Anterior Thigh Region  No depletion  Posterior Calf Region  No depletion   Edema (RD Assessment)  -- [non pitting]  Hair  Reviewed  Eyes  Reviewed  Mouth  Reviewed  Skin  Reviewed  Nails  Reviewed     Diet Order:   Diet Order            Diet regular Room service appropriate? Yes; Fluid consistency: Thin  Diet effective now             EDUCATION NEEDS:   No education needs have been identified at this time  Skin:  Skin Assessment: Reviewed RN Assessment  Last BM:  Unknown  Height:   Ht Readings from Last 1 Encounters:  08/19/19 '5\' 5"'$  (1.651 m)   Weight:   Wt Readings from Last 1 Encounters:  08/19/19 113.9 kg   BMI:  Body mass index is 41.77 kg/m.  Estimated Nutritional Needs:   Kcal:  2000-2200  Protein:  100-115 grams  Fluid:  >/= 2 L/day  Jacklynn Barnacle, MS, RD, LDN Pager number available on Amion

## 2019-08-20 NOTE — ED Notes (Signed)
Pt got up and walked to bathroom. Pt room modified, lights dimmed and TV turned on. Pt is on cardiac, bp and pulse ox monitoring.

## 2019-08-20 NOTE — Discharge Summary (Signed)
[facility] - [place] [facility]   PATIENT NAME: Katrina Lara    MR#:  [id number]  DATE OF BIRTH:  1956/02/03  DATE OF ADMISSION:  08/19/2019 ADMITTING PHYSICIAN: Jacques Navy, MD  DATE OF DISCHARGE: 08/20/2019  PRIMARY CARE PHYSICIAN: Danella Penton, MD    ADMISSION DIAGNOSIS:  Portal vein thrombosis [I81] PVT (portal vein thrombosis) [I81]  DISCHARGE DIAGNOSIS:  Portal Vein Thrombosis  SECONDARY DIAGNOSIS:   Past Medical History:  Diagnosis Date  . Hypertension   . Thyroid disease     HOSPITAL COURSE:   Katrina Cheeks Foustis a 64 y.o.femalewith medical history significant ofHTN, hypothyroidismwho presents with lower abdominal pain over the last week, mainly in the suprapubic region and left lower quadrant, coming in waves but constant, and associated with nausea but no vomiting.  CT abd/pelvis reveals thrombosis of the main, right and left portal veins.  1. Portal vein thrombosis - patient w/o predisposing medical condition, prior h/o blood clots. Reports normal pregnancies and deliveries. Not a drinker. -Full dose Heparin anticoagulation for now and  switch to NOAC for full 6 months tx -Vascular consult with Dr Wyn Quaker-- appreciate input. Agrees with oral anticoagulation follow-up in the clinic. -Coag w/u: AT III, Protein C, S, Lieden V--sent out--results pending-- will be followed up with primary care and or vascular sx -- appreciate TOC's input for getting eliquis through med management clinic   2. HTN- continue home meds  3. Hypothyroidism - chronic problem -Continue home meds  DVT prophylaxis:full dose heparin--now on eliquis Code Status:full code Family Communication:Spoke with Verne Grain, husband. At bedside Disposition Plan:Home today Consults called:Dr. Wyn Quaker for vascular surgery   Admission status:inpatient  Status is: Inpatient  Dispo: The patient is from: Home  Anticipated d/c is to: Home   Anticipated d/c date is: 1 day  Patient currently is  medically stable to d/c at present  D/w pt and husband CONSULTS OBTAINED:    DRUG ALLERGIES:  No Known Allergies  DISCHARGE MEDICATIONS:   Allergies as of 08/20/2019   No Known Allergies     Medication List    TAKE these medications   apixaban 5 MG Tabs tablet Commonly known as: ELIQUIS Take 2 tablets (10 mg total) by mouth 2 (two) times daily. And then from 08/27/2019 start taking 5 mg bid   dicyclomine 20 MG tablet Commonly known as: BENTYL Take 1 tablet (20 mg total) by mouth 4 (four) times daily -  before meals and at bedtime for 5 days.   hydrochlorothiazide 50 MG tablet Commonly known as: HYDRODIURIL Take 50 mg by mouth daily.   levothyroxine 125 MCG tablet Commonly known as: SYNTHROID Take 125 mcg by mouth daily.   potassium chloride 10 MEQ tablet Commonly known as: KLOR-CON Take 10 mEq by mouth 3 (three) times daily.   promethazine 25 MG tablet Commonly known as: PHENERGAN Take 1 tablet (25 mg total) by mouth every 8 (eight) hours as needed for nausea or vomiting.   spironolactone 25 MG tablet Commonly known as: ALDACTONE Take 25 mg by mouth daily.       If you experience worsening of your admission symptoms, develop shortness of breath, life threatening emergency, suicidal or homicidal thoughts you must seek medical attention immediately by calling 911 or calling your MD immediately  if symptoms less severe.  You Must read complete instructions/literature along with all the possible adverse reactions/side effects for all the Medicines you take and that have been prescribed to you. Take any new  Medicines after you have completely understood and accept all the possible adverse reactions/side effects.   Please note  You were cared for by a hospitalist during your hospital stay. If you have any questions about your discharge medications or the care you received while you were in the hospital  after you are discharged, you can call the unit and asked to speak with the hospitalist on call if the hospitalist that took care of you is not available. Once you are discharged, your primary care physician will handle any further medical issues. Please note that NO REFILLS for any discharge medications will be authorized once you are discharged, as it is imperative that you return to your primary care physician (or establish a relationship with a primary care physician if you do not have one) for your aftercare needs so that they can reassess your need for medications and monitor your lab values.  DATA REVIEW:   CBC  Recent Labs  Lab 08/20/19 0255  WBC 9.9  HGB 11.8*  HCT 35.6*  PLT 247    Chemistries  Recent Labs  Lab 08/19/19 1708  NA 132*  K 3.4*  CL 98  CO2 24  GLUCOSE 107*  BUN 15  CREATININE 1.18*  CALCIUM 9.2  AST 43*  ALT 42  ALKPHOS 93  BILITOT 0.8    Microbiology Results   Recent Results (from the past 240 hour(s))  Respiratory Panel by RT PCR (Flu A&B, Covid) - Nasopharyngeal Swab     Status: None   Collection Time: 08/19/19  7:30 PM   Specimen: Nasopharyngeal Swab  Result Value Ref Range Status   SARS Coronavirus 2 by RT PCR NEGATIVE NEGATIVE Final    Comment: (NOTE) SARS-CoV-2 target nucleic acids are NOT DETECTED. The SARS-CoV-2 RNA is generally detectable in upper respiratoy specimens during the acute phase of infection. The lowest concentration of SARS-CoV-2 viral copies this assay can detect is 131 copies/mL. A negative result does not preclude SARS-Cov-2 infection and should not be used as the sole basis for treatment or other patient management decisions. A negative result may occur with  improper specimen collection/handling, submission of specimen other than nasopharyngeal swab, presence of viral mutation(s) within the areas targeted by this assay, and inadequate number of viral copies (<131 copies/mL). A negative result must be combined with  clinical observations, patient history, and epidemiological information. The expected result is Negative. Fact Sheet for Patients:  PinkCheek.be Fact Sheet for Healthcare Providers:  GravelBags.it This test is not yet ap proved or cleared by the [place] FDA and  has been authorized for detection and/or diagnosis of SARS-CoV-2 by FDA under an Emergency Use Authorization (EUA). This EUA will remain  in effect (meaning this test can be used) for the duration of the COVID-19 declaration under Section 564(b)(1) of the Act, 21 U.S.C. section 360bbb-3(b)(1), unless the authorization is terminated or revoked sooner.    Influenza A by PCR NEGATIVE NEGATIVE Final   Influenza B by PCR NEGATIVE NEGATIVE Final    Comment: (NOTE) The Xpert Xpress SARS-CoV-2/FLU/RSV assay is intended as an aid in  the diagnosis of influenza from Nasopharyngeal swab specimens and  should not be used as a sole basis for treatment. Nasal washings and  aspirates are unacceptable for Xpert Xpress SARS-CoV-2/FLU/RSV  testing. Fact Sheet for Patients: PinkCheek.be Fact Sheet for Healthcare Providers: GravelBags.it This test is not yet approved or cleared by the [place] FDA and  has been authorized for detection and/or diagnosis of SARS-CoV-2 by  FDA under an Emergency Use Authorization (EUA). This EUA will remain  in effect (meaning this test can be used) for the duration of the  Covid-19 declaration under Section 564(b)(1) of the Act, 21  U.S.C. section 360bbb-3(b)(1), unless the authorization is  terminated or revoked. Performed at [facility], [address] [place], [place] [address]     RADIOLOGY:  CT ABDOMEN PELVIS W CONTRAST  Result Date: 08/19/2019 CLINICAL DATA:  Acute left lower quadrant abdominal pain. EXAM: CT ABDOMEN AND PELVIS WITH CONTRAST TECHNIQUE: Multidetector  CT imaging of the abdomen and pelvis was performed using the standard protocol following bolus administration of intravenous contrast. CONTRAST:  OMNIPAQUE IOHEXOL 300 MG/ML  SOLN COMPARISON:  August 11, 2019. FINDINGS: Lower chest: No acute abnormality. Hepatobiliary: No gallstones or biliary dilatation is noted. No focal parenchymal abnormality is noted in the liver. However, there appears to be thrombosis of the main, left and right portal veins. The splenic and superior mesenteric veins are patent. Pancreas: Unremarkable. No pancreatic ductal dilatation or surrounding inflammatory changes. Spleen: Normal in size without focal abnormality. Adrenals/Urinary Tract: Adrenal glands are unremarkable. Kidneys are normal, without renal calculi, focal lesion, or hydronephrosis. Bladder is unremarkable. Stomach/Bowel: Stomach is within normal limits. Appendix appears normal. No evidence of bowel wall thickening, distention, or inflammatory changes. Vascular/Lymphatic: As noted above, there appears to be acute thrombosis of the main, left and right portal veins. Abdominal aorta is unremarkable. No adenopathy is noted. Reproductive: Status post hysterectomy. No adnexal masses. Other: Small fat containing right inguinal hernia is noted. No ascites is noted. Musculoskeletal: No acute or significant osseous findings. IMPRESSION: Findings consistent with acute thrombosis of the main, left and right portal veins. Critical Value/emergent results were called by telephone at the time of interpretation on 08/19/2019 at 7:04 pm to provider Dr. Roxan Hockey, who verbally acknowledged these results. Electronically Signed   By: Lupita Raider M.D.   On: 08/19/2019 19:04   DG Chest Portable 1 View  Result Date: 08/19/2019 CLINICAL DATA:  Shortness of breath EXAM: PORTABLE CHEST 1 VIEW COMPARISON:  10/04/2008 FINDINGS: Linear scarring or atelectasis in the right upper lung. No consolidation or effusion. Normal heart size. No  pneumothorax. IMPRESSION: No active disease. Electronically Signed   By: Jasmine Pang M.D.   On: 08/19/2019 17:48     CODE STATUS:     Code Status Orders  (From admission, onward)         Start     Ordered   08/19/19 2134  Full code  Continuous     08/19/19 2139        Code Status History    This patient has a current code status but no historical code status.   Advance Care Planning Activity       TOTAL TIME TAKING CARE OF THIS PATIENT: *40* minutes.    Enedina Finner M.D  [facility]    CC: Primary care physician; Danella Penton, MD

## 2019-08-20 NOTE — Plan of Care (Signed)
  Problem: Consults Goal: Venous Thromboembolism Patient Education Description: See Patient Education Module for education specifics. Outcome: Progressing   Problem: Phase I Progression Outcomes Goal: Pain controlled with appropriate interventions Outcome: Progressing Goal: Dyspnea controlled at rest (PE) Outcome: Progressing Goal: Tolerating diet Outcome: Progressing Goal: Initial discharge plan identified Outcome: Progressing Goal: Voiding-avoid urinary catheter unless indicated Outcome: Progressing Goal: Hemodynamically stable Outcome: Progressing Goal: Other Phase I Outcomes/Goals Outcome: Progressing   Problem: Phase II Progression Outcomes Goal: Therapeutic drug levels for anticoagulation Outcome: Progressing Goal: 02 sats trending upward/stable (PE) Outcome: Progressing Goal: Discharge plan established Outcome: Progressing Goal: Tolerating diet Outcome: Progressing Goal: Other Phase II Outcomes/Goals Outcome: Progressing   Problem: Phase III Progression Outcomes Goal: 02 sats stabilized Outcome: Progressing Goal: Activity at appropriate level-compared to baseline Description: (UP IN CHAIR FOR HEMODIALYSIS) Outcome: Progressing Goal: Discharge plan remains appropriate-arrangements made Outcome: Progressing Goal: Other Phase III Outcomes/Goals Outcome: Progressing   Problem: Clinical Measurements: Goal: Ability to maintain clinical measurements within normal limits will improve Outcome: Progressing Goal: Will remain free from infection Outcome: Progressing Goal: Diagnostic test results will improve Outcome: Progressing Goal: Respiratory complications will improve Outcome: Progressing Goal: Cardiovascular complication will be avoided Outcome: Progressing

## 2019-08-20 NOTE — Consult Note (Addendum)
ANTICOAGULATION CONSULT NOTE  Pharmacy Consult for Eliquis  Indication: VTE treatment   No Known Allergies  Patient Measurements: Height: 5\' 5"  (165.1 cm) Weight: 113.9 kg (251 lb) IBW/kg (Calculated) : 57 Heparin Dosing Weight: 84 kg   Vital Signs: Temp: 98 F (36.7 C) (05/06 0813) Temp Source: Oral (05/06 0813) BP: 111/62 (05/06 0813) Pulse Rate: 67 (05/06 0813)  Labs: Recent Labs    08/19/19 1708 08/20/19 0255 08/20/19 0852  HGB 12.7 11.8*  --   HCT 40.1 35.6*  --   PLT 316 247  --   APTT 29  --   --   LABPROT 13.6  --   --   INR 1.1  --   --   HEPARINUNFRC  --  0.65 0.50  CREATININE 1.18*  --   --     Estimated Creatinine Clearance: 63.1 mL/min (A) (by C-G formula based on SCr of 1.18 mg/dL (H)).   Medications:  No prior anticoagulants - confirmed with patient.   Assessment: Pharmacy has been consulted for heparin dosing in this patient who was referred by her PCP for "acute appendicitis with peritonitis". Imaging shows portal vein thrombosis. Baseline H/H and platelets WNL.    Goal of Therapy:  Heparin level 0.3-0.7 units/ml Monitor platelets by anticoagulation protocol: Yes   Plan:  Consult to transition to Eliquis. Will start 10 mg BID x 7 days followed by 5 mg BID.  10/20/19, PharmD Clinical Pharmacist 08/20/2019,10:44 AM

## 2019-08-21 LAB — PROTEIN C, TOTAL: Protein C, Total: 150 % (ref 60–150)

## 2019-08-21 LAB — PROTEIN S PANEL
Protein S Activity: 83 % (ref 63–140)
Protein S Ag, Free: 82 % (ref 57–157)
Protein S Ag, Total: 134 % (ref 60–150)

## 2019-08-21 LAB — PROTEIN C ACTIVITY: Protein C Activity: 134 % (ref 73–180)

## 2019-08-22 ENCOUNTER — Ambulatory Visit: Payer: Self-pay

## 2019-08-26 LAB — FACTOR 5 LEIDEN

## 2019-09-10 MED FILL — HYDROCHLOROTHIAZIDE 50 MG T: 50 | 90 days supply | Qty: 90 | Fill #1

## 2019-09-10 MED FILL — LEVOTHYROXINE 125 MCG TABLE: 125 | 90 days supply | Qty: 90 | Fill #1

## 2019-09-10 MED FILL — SPIRONOLACTONE 25 MG TABS: 25 | 90 days supply | Qty: 90 | Fill #2

## 2019-09-16 ENCOUNTER — Other Ambulatory Visit: Payer: Self-pay

## 2019-09-16 ENCOUNTER — Ambulatory Visit: Payer: Self-pay | Admitting: Pharmacy Technician

## 2019-09-16 DIAGNOSIS — Z79899 Other long term (current) drug therapy: Secondary | ICD-10-CM

## 2019-09-16 NOTE — Progress Notes (Signed)
Completed [facility] application and contract.  Patient agreed to all terms of the [facility] contract.  Patient to provide 2021 Social Security Award Letter and 2020 taxes.  Provided patient with community resource material based on her particular needs.    Eliquis Prescription Application completed with patient.  Forwarded to Dr. Hyacinth Meeker for signature.  Upon receipt of signed application from provider and proof of income from patient, Eliquis Prescription Application will be submitted to [facility].  [name] Care Manager [facility]

## 2019-09-29 ENCOUNTER — Telehealth: Payer: Self-pay | Admitting: Pharmacy Technician

## 2019-09-29 NOTE — Telephone Encounter (Signed)
Received updated proof of income.  Patient eligible to receive medication assistance at [facility] until time for re-certification in 2022, and as long as eligibility requirements continue to be met. ° °Katrina Lara J. [name] °Care Manager °[facility] °

## 2020-05-06 ENCOUNTER — Ambulatory Visit: Payer: Self-pay | Admitting: Pharmacist

## 2020-05-06 ENCOUNTER — Encounter: Payer: Self-pay | Admitting: Pharmacist

## 2020-05-06 ENCOUNTER — Other Ambulatory Visit: Payer: Self-pay

## 2020-05-06 DIAGNOSIS — Z79899 Other long term (current) drug therapy: Secondary | ICD-10-CM

## 2020-05-06 NOTE — Progress Notes (Addendum)
[facility] Visit Note  Patient: Katrina Lara MRN: [id number] Date of Birth: 02-Sep-1955 PCP: Danella Penton, MD   [name] 65 y.o. female presents for a telephone visit for medication management today. Verified patient with two identifiers.   There were no vitals taken for this visit.  Patient Information   Past Medical History:  Diagnosis Date   Hypertension    Thyroid disease       Past Surgical History:  Procedure Laterality Date   ABDOMINAL HYSTERECTOMY       Family History  Problem Relation Age of Onset   Cardiomyopathy Mother    Hypertension Mother    Hypertension Father    Hypertension Sister    Breast cancer Neg Hx     New Diagnoses (since last visit): N/A  Family Support: Good  Lifestyle Diet: Breakfast: bacon, eggs Lunch: salad, sometimes a hamburger with the salad Dinner: protein and veggies Drinks: water, OJ; cutting back on soda    Current Exercise Habits: Home exercise routine, Type of exercise: walking, Time (Minutes): 30, Frequency (Times/Week): 3, Weekly Exercise (Minutes/Week): 90, Intensity: Moderate       Social History   Substance and Sexual Activity  Alcohol Use None      Social History   Tobacco Use  Smoking Status Never Smoker  Smokeless Tobacco Never Used      Health Maintenance  Topic Date Due   Hepatitis C Screening  Never done   TETANUS/TDAP  Never done   PAP SMEAR-Modifier  Never done   COLONOSCOPY (Pts 45-34yrs Insurance coverage will need to be confirmed)  Never done   COVID-19 Vaccine (3 - Booster for Pfizer series) 02/10/2020   MAMMOGRAM  06/01/2021   INFLUENZA VACCINE  Completed   HIV Screening  Completed   Health Maintenance/Date Completed  Last ED visit: 08/19/2019 Last Visit to PCP: ~6 mo ago  Next Visit to PCP: 05/2020 Dental Exam: had appt scheduled 04/16/20, however was cancelled d/t short staffing Eye Exam: 02/2020 Pelvic/PAP Exam: 2021 Mammogram:  2021 Colonoscopy: none Flu Vaccine: completed Pneumonia Vaccine: completed COVID-19 Vaccine: completed primary series and booster   Outpatient Encounter Medications as of 05/06/2020  Medication Sig   hydrochlorothiazide (HYDRODIURIL) 50 MG tablet Take 50 mg by mouth daily.   levothyroxine (SYNTHROID) 125 MCG tablet Take 125 mcg by mouth daily.   potassium chloride (KLOR-CON) 10 MEQ tablet Take 10 mEq by mouth 2 (two) times daily.   spironolactone (ALDACTONE) 25 MG tablet Take 25 mg by mouth daily. (Patient not taking: Reported on 05/06/2020)   topiramate (TOPAMAX) 100 MG tablet Take 100 mg by mouth daily.   [DISCONTINUED] apixaban (ELIQUIS) 5 MG TABS tablet Take 2 tablets (10 mg total) by mouth 2 (two) times daily. And then from 08/27/2019 start taking 5 mg bid   [DISCONTINUED] dicyclomine (BENTYL) 20 MG tablet Take 1 tablet (20 mg total) by mouth 4 (four) times daily -  before meals and at bedtime for 5 days.   [DISCONTINUED] promethazine (PHENERGAN) 25 MG tablet Take 1 tablet (25 mg total) by mouth every 8 (eight) hours as needed for nausea or vomiting.   No facility-administered encounter medications on file as of 05/06/2020.     Assessment and Plan: Portal vein thrombosis Pt was previously in the hospital around May 2021 for PVT and was given apixaban for 6 months. She has completed the treatment and did not express any issues.   HTN Pt takes HCTZ for HTN. Unable to assess BP  due to telephone visit. Pt does not have a cuff to keep a log of her BP readings. States her BP reading at her last appt was good. She maintains a healthy diet of protein, vegetables, and has been reducing her carbohydrate and sugar intake. She is able to exercise for 30 min/day 3x/wk. Encouraged pt to continue her diet and exercise. Continue current regimen.   Hypothyroidism Pt takes levothyroxine daily. TSH 08/20/2019 was 0.416 (WNL). Pt did not express any s/sx of hypo/hyperthyroidism. Educated pt  on drug interactions with levothyroxine in case she were to decide to start taking an OTC multivitamin. Continue current regimen.   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 Pt did not complain of any symptoms of hyperkalemia (heart palpitations, shortness of breath, chest pain, nausea, or vomiting). Continue current regimen.   Access/Adherence Pt is able to remain adherent to medications by keeping the medications out where she can see them. She did mention topiramate, but when I inquired what she may be taking it for she was unsure even after I listed a few options. She is unsure if she should still be taking this. Instructed pt to ask her doctor at her next visit before stopping abruptly in case she needs to be tapered off.    Raiford Noble, PharmD Pharmacy Resident  05/06/2020 10:15 AM

## 2020-05-25 ENCOUNTER — Other Ambulatory Visit: Payer: Self-pay | Admitting: Internal Medicine

## 2020-06-17 ENCOUNTER — Other Ambulatory Visit: Payer: Self-pay | Admitting: Internal Medicine

## 2020-07-05 ENCOUNTER — Telehealth: Payer: Self-pay | Admitting: Pharmacy Technician

## 2020-07-05 NOTE — Telephone Encounter (Signed)
Patient failed to provide 2022 proof of income.  No additional medication assistance will be provided by [facility] without the required proof of income documentation.  Patient notified by letter.  [name] Care Manager [facility]   [name] [place], [place]  [address]    July 04, 2020    Katrina Lara [address] [facility] [address] [place], [place]  [address]  Dear [name]:  This is to inform you that you are no longer eligible to receive medication assistance at [facility].  The reason(s) are:    _____Your total gross monthly household income exceeds 250% of the Federal Poverty Level.   _____Tangible assets (savings, checking, stocks/bonds, pension, retirement, etc.) exceeds our limit  _____You are eligible to receive benefits from [facility], [facility] or HIV Medication              Assistance Program _____You are eligible to receive benefits from a Medicare Part "D" plan _____You have prescription insurance  _____You are not an [facility] resident __X__Failure to provide all requested proof of income information for 2022.    Medication assistance will resume once all requested financial information has been returned to our clinic.  If you have questions, please contact our clinic at [phone].    Thank you,  [facility]

## 2020-07-16 ENCOUNTER — Other Ambulatory Visit: Payer: Self-pay

## 2020-07-16 MED FILL — Potassium Chloride Tab ER 10 mEq: ORAL | 90 days supply | Qty: 180 | Fill #0 | Status: CN

## 2020-07-19 ENCOUNTER — Other Ambulatory Visit: Payer: Self-pay

## 2020-07-26 ENCOUNTER — Other Ambulatory Visit: Payer: Self-pay

## 2020-07-29 ENCOUNTER — Other Ambulatory Visit: Payer: Self-pay

## 2020-08-01 ENCOUNTER — Other Ambulatory Visit: Payer: Self-pay

## 2020-08-03 ENCOUNTER — Other Ambulatory Visit: Payer: Self-pay

## 2020-08-24 ENCOUNTER — Ambulatory Visit: Payer: Self-pay | Attending: Oncology | Admitting: *Deleted

## 2020-08-24 ENCOUNTER — Ambulatory Visit
Admission: RE | Admit: 2020-08-24 | Discharge: 2020-08-24 | Disposition: A | Discharge status: Home / Self Care | Payer: Self-pay | Source: Ambulatory Visit | Attending: Oncology | Admitting: Oncology

## 2020-08-24 ENCOUNTER — Encounter: Payer: Self-pay | Admitting: *Deleted

## 2020-08-24 ENCOUNTER — Other Ambulatory Visit: Payer: Self-pay

## 2020-08-24 VITALS — BP 128/74 | HR 68 | Temp 97.2°F | Ht 64.0 in | Wt 261.0 lb

## 2020-08-24 DIAGNOSIS — Z0000 Encounter for general adult medical examination without abnormal findings: Secondary | ICD-10-CM

## 2020-08-24 MED FILL — Potassium Chloride Tab ER 10 mEq: ORAL | 90 days supply | Qty: 180 | Fill #0 | Status: AC

## 2020-08-24 NOTE — Patient Instructions (Signed)
Gave patient hand-out, Women Staying Healthy, Active and Well from [facility], with education on breast health, pap smears, heart and colon health. 

## 2020-08-24 NOTE — Progress Notes (Signed)
  Subjective:     Patient ID: Katrina Lara, female   DOB: Jun 23, 1955, 65 y.o.   MRN: [id number]  HPI   BCCCP Medical History Record - 08/24/20 [id number]      Breast History   Screening cycle New    Provider ([facility]) Dr. Bethann Punches    Initial Mammogram 08/24/20    Last Mammogram Annual    Last Mammogram Date 06/02/19    Provider (Mammogram)  [name]    Recent Breast Symptoms None      Breast Cancer History   Breast Cancer History No personal or family history      Previous History of Breast Problems   Breast Surgery or Biopsy None    Breast Implants N/A    BSE Done Monthly      Gynecological/Obstetrical History   LMP --   65 years old   Is there any chance that the client could be pregnant?  No    Age at menarche 20    Age at menopause 70    Age at first live birth 37    Breast fed children No    DES Exposure No    Cervical, Uterine or Ovarian cancer No    Family history of Cervial, Uterine or Ovarian cancer No    Hysterectomy Yes    Cervix removed Yes    Ovaries removed Yes    Laser/Cryosurgery No    Current method of birth control None    Current method of Estrogen/Hormone replacement None    Smoking history None             Review of Systems     Objective:   Physical Exam Chest:  Breasts:     Breasts are asymmetrical.     Right: No swelling, bleeding, inverted nipple, mass, nipple discharge, skin change, tenderness, axillary adenopathy or supraclavicular adenopathy.     Left: No swelling, bleeding, inverted nipple, mass, nipple discharge, skin change, tenderness, axillary adenopathy or supraclavicular adenopathy.      Comments: Right breast slightly larger than the left Lymphadenopathy:     Upper Body:     Right upper body: No supraclavicular or axillary adenopathy.     Left upper body: No supraclavicular or axillary adenopathy.        Assessment:     65 year old female returns to [facility] for annual screening.  Clinical breast exam unremarkable.  Taught  self breast awareness.  Patient with a history of hysterectomy.  Pap omitted per protocol.  Patient has been screened for eligibility.  She does not have any insurance, Medicare or Medicaid.  She also meets financial eligibility.   Risk Assessment    Risk Scores      08/24/2020 06/02/2019   Last edited by: Scarlett Presto, RN Scarlett Presto, RN   5-year risk: 1.2 % 1.1 %   Lifetime risk: 5 % 5.2 %            Plan:     Screening mammogram ordered.  Will follow up per BCCCP protocol.

## 2020-08-24 NOTE — Progress Notes (Signed)
Letter mailed from the [facility] to inform patient of her normal mammogram results.  Patient is to follow-up with annual screening in one year. 

## 2020-08-29 ENCOUNTER — Telehealth: Payer: Self-pay | Admitting: Pharmacy Technician

## 2020-08-29 NOTE — Telephone Encounter (Signed)
Patient failed to provide 2021 Federal Tax Return.  No additional medication assistance will be provided until Tax Return is provided.  Patient was notified.  [name] Care Manager [facility]

## 2020-09-05 ENCOUNTER — Other Ambulatory Visit: Payer: Self-pay

## 2020-09-14 ENCOUNTER — Other Ambulatory Visit: Payer: Self-pay

## 2020-09-14 MED FILL — Hydrochlorothiazide Tab 25 MG: ORAL | 30 days supply | Qty: 30 | Fill #0 | Status: AC

## 2020-09-14 MED FILL — Levothyroxine Sodium Tab 100 MCG: ORAL | 30 days supply | Qty: 30 | Fill #0 | Status: AC

## 2020-09-22 ENCOUNTER — Telehealth: Payer: Self-pay | Admitting: Pharmacist

## 2020-09-22 NOTE — Telephone Encounter (Signed)
Patient approved for medication assistance at [facility] until 06/14/21, as long as eligibility criteria continues to be met.   Vonda [facility] Administrative Assistant 

## 2020-09-23 ENCOUNTER — Other Ambulatory Visit: Payer: Self-pay

## 2020-10-14 ENCOUNTER — Other Ambulatory Visit: Payer: Self-pay

## 2020-10-14 MED FILL — Levothyroxine Sodium Tab 100 MCG: ORAL | 30 days supply | Qty: 30 | Fill #1 | Status: AC

## 2020-10-24 ENCOUNTER — Other Ambulatory Visit: Payer: Self-pay

## 2020-11-01 ENCOUNTER — Other Ambulatory Visit: Payer: Self-pay

## 2020-11-07 ENCOUNTER — Other Ambulatory Visit: Payer: Self-pay

## 2020-11-07 MED FILL — Hydrochlorothiazide Tab 25 MG: ORAL | 30 days supply | Qty: 30 | Fill #1 | Status: AC

## 2020-11-10 ENCOUNTER — Other Ambulatory Visit: Payer: Self-pay

## 2020-11-10 MED FILL — Levothyroxine Sodium Tab 100 MCG: ORAL | 30 days supply | Qty: 30 | Fill #2 | Status: AC

## 2020-12-13 ENCOUNTER — Other Ambulatory Visit: Payer: Self-pay

## 2020-12-13 MED FILL — Levothyroxine Sodium Tab 100 MCG: ORAL | 30 days supply | Qty: 30 | Fill #3 | Status: AC

## 2020-12-13 MED FILL — Potassium Chloride Tab ER 10 mEq: ORAL | 30 days supply | Qty: 60 | Fill #1 | Status: AC

## 2020-12-13 MED FILL — Hydrochlorothiazide Tab 25 MG: ORAL | 30 days supply | Qty: 30 | Fill #2 | Status: AC

## 2020-12-15 ENCOUNTER — Other Ambulatory Visit: Payer: Self-pay

## 2021-01-11 ENCOUNTER — Other Ambulatory Visit: Payer: Self-pay

## 2021-01-11 MED FILL — Hydrochlorothiazide Tab 25 MG: ORAL | 30 days supply | Qty: 30 | Fill #3 | Status: AC

## 2021-01-11 MED FILL — Potassium Chloride Tab ER 10 mEq: ORAL | 30 days supply | Qty: 60 | Fill #2 | Status: AC

## 2021-01-13 ENCOUNTER — Other Ambulatory Visit: Payer: Self-pay

## 2021-01-16 ENCOUNTER — Other Ambulatory Visit: Payer: Self-pay

## 2021-01-16 MED ORDER — LEVOTHYROXINE SODIUM 100 MCG PO TABS
ORAL_TABLET | ORAL | 3 refills | Status: DC
  Filled 2021-01-16: qty 90, 90d supply, fill #0
  Filled 2021-01-16: qty 93, 93d supply, fill #0
  Filled 2021-04-17: qty 93, 93d supply, fill #1
  Filled 2021-04-18: qty 90, 90d supply, fill #1

## 2021-01-17 ENCOUNTER — Other Ambulatory Visit: Payer: Self-pay

## 2021-01-17 ENCOUNTER — Ambulatory Visit: Payer: Self-pay

## 2021-02-15 ENCOUNTER — Other Ambulatory Visit: Payer: Self-pay

## 2021-02-15 MED ORDER — FLUCONAZOLE 100 MG PO TABS
100.0000 mg | ORAL_TABLET | Freq: Every day | ORAL | 0 refills | Status: AC
  Filled 2021-02-15: qty 5, 5d supply, fill #0

## 2021-02-20 ENCOUNTER — Other Ambulatory Visit: Payer: Self-pay

## 2021-02-20 MED FILL — Potassium Chloride Tab ER 10 mEq: ORAL | 30 days supply | Qty: 60 | Fill #3 | Status: AC

## 2021-02-20 MED FILL — Hydrochlorothiazide Tab 25 MG: ORAL | 30 days supply | Qty: 30 | Fill #4 | Status: AC

## 2021-03-21 ENCOUNTER — Other Ambulatory Visit: Payer: Self-pay

## 2021-03-21 MED FILL — Hydrochlorothiazide Tab 25 MG: ORAL | 30 days supply | Qty: 30 | Fill #5 | Status: AC

## 2021-03-21 MED FILL — Potassium Chloride Tab ER 10 mEq: ORAL | 30 days supply | Qty: 60 | Fill #4 | Status: AC

## 2021-04-17 ENCOUNTER — Other Ambulatory Visit: Payer: Self-pay

## 2021-04-18 ENCOUNTER — Other Ambulatory Visit: Payer: Self-pay

## 2021-04-18 MED FILL — Hydrochlorothiazide Tab 25 MG: ORAL | 30 days supply | Qty: 30 | Fill #6 | Status: AC

## 2021-04-18 MED FILL — Potassium Chloride Tab ER 10 mEq: ORAL | 30 days supply | Qty: 60 | Fill #5 | Status: AC

## 2021-05-29 ENCOUNTER — Other Ambulatory Visit: Payer: Self-pay

## 2021-05-29 MED ORDER — LEVOTHYROXINE SODIUM 100 MCG PO TABS
ORAL_TABLET | ORAL | 3 refills | Status: DC
  Filled 2021-07-17: qty 90, 90d supply, fill #0
  Filled 2021-10-16: qty 90, 90d supply, fill #1
  Filled 2021-10-16: qty 90, 90d supply, fill #0
  Filled 2022-01-11: qty 90, 90d supply, fill #1
  Filled 2022-04-12: qty 90, 90d supply, fill #2

## 2021-05-29 MED ORDER — POTASSIUM CHLORIDE ER 10 MEQ PO TBCR
EXTENDED_RELEASE_TABLET | ORAL | 3 refills | Status: DC
  Filled 2021-05-29: qty 180, 90d supply, fill #0
  Filled 2021-08-30: qty 60, 30d supply, fill #1
  Filled 2021-10-16: qty 60, 30d supply, fill #2
  Filled 2021-10-16: qty 60, 30d supply, fill #0
  Filled 2021-11-20 – 2021-12-25 (×2): qty 60, 30d supply, fill #1
  Filled 2022-02-05: qty 60, 30d supply, fill #2
  Filled 2022-03-19: qty 60, 30d supply, fill #3
  Filled 2022-05-14: qty 60, 30d supply, fill #4

## 2021-05-29 MED ORDER — HYDROCHLOROTHIAZIDE 25 MG PO TABS
25.0000 mg | ORAL_TABLET | Freq: Every day | ORAL | 3 refills | Status: DC
  Filled 2021-05-29: qty 90, 90d supply, fill #0
  Filled 2021-08-30: qty 90, 90d supply, fill #1
  Filled 2021-11-20: qty 90, 90d supply, fill #2
  Filled 2021-11-20 – 2021-12-25 (×2): qty 90, 90d supply, fill #0
  Filled 2022-04-12: qty 90, 90d supply, fill #1

## 2021-07-17 ENCOUNTER — Other Ambulatory Visit: Payer: Self-pay

## 2021-08-02 ENCOUNTER — Telehealth: Payer: Self-pay | Admitting: Pharmacy Technician

## 2021-08-02 NOTE — Telephone Encounter (Signed)
Received updated proof of income.  Patient eligible to receive medication assistance at [facility] until time for re-certification in 2024, and as long as eligibility requirements continue to be met. ? ?Katrina Lara ?Care Manager ?[facility]  ?

## 2021-08-17 ENCOUNTER — Other Ambulatory Visit: Payer: Self-pay

## 2021-08-30 ENCOUNTER — Other Ambulatory Visit: Payer: Self-pay

## 2021-10-16 ENCOUNTER — Other Ambulatory Visit: Payer: Self-pay

## 2021-10-19 ENCOUNTER — Other Ambulatory Visit: Payer: Self-pay

## 2021-11-06 ENCOUNTER — Telehealth: Payer: Self-pay | Admitting: *Deleted

## 2021-11-06 NOTE — Telephone Encounter (Signed)
Attempted to call patient to reschedule BCCCP appointment. No one answered phone. Left voicemail for patient to call me back.

## 2021-11-15 ENCOUNTER — Ambulatory Visit: Payer: Self-pay

## 2021-11-20 ENCOUNTER — Other Ambulatory Visit: Payer: Self-pay

## 2021-11-29 ENCOUNTER — Other Ambulatory Visit: Payer: Self-pay

## 2021-11-29 DIAGNOSIS — Z1231 Encounter for screening mammogram for malignant neoplasm of breast: Secondary | ICD-10-CM

## 2021-12-04 ENCOUNTER — Ambulatory Visit
Admission: RE | Admit: 2021-12-04 | Discharge: 2021-12-04 | Disposition: A | Discharge status: Home / Self Care | Payer: Self-pay | Source: Ambulatory Visit | Attending: Obstetrics and Gynecology | Admitting: Obstetrics and Gynecology

## 2021-12-04 ENCOUNTER — Other Ambulatory Visit: Payer: Self-pay

## 2021-12-04 ENCOUNTER — Ambulatory Visit: Payer: Self-pay | Attending: Hematology and Oncology | Admitting: *Deleted

## 2021-12-04 VITALS — BP 129/65 | Wt 276.1 lb

## 2021-12-04 DIAGNOSIS — Z1211 Encounter for screening for malignant neoplasm of colon: Secondary | ICD-10-CM

## 2021-12-04 DIAGNOSIS — Z1239 Encounter for other screening for malignant neoplasm of breast: Secondary | ICD-10-CM

## 2021-12-04 DIAGNOSIS — Z1231 Encounter for screening mammogram for malignant neoplasm of breast: Secondary | ICD-10-CM | POA: Insufficient documentation

## 2021-12-04 NOTE — Patient Instructions (Signed)
Explained breast self awareness with [name]. Patient did not need a Pap smear today due to patient has a history of a hysterectomy for benign reasons. Let patient know she doesn't need any further Pap smears due to her history of a hysterectomy for benign reasons. Referred patient to the [facility] for a screening mammogram. Appointment scheduled Monday, December 04, 2021 at 1400. Patient aware of appointment and will be there. Let patient know [name] will follow up with her within the next couple weeks with results of mammogram by letter or phone. Katrina Lara verbalized understanding.  Meaghann Choo, Kathaleen Maser, RN 1:23 PM

## 2021-12-04 NOTE — Progress Notes (Signed)
[name] is a 66 y.o. female who presents to [facility] clinic today with no complaints.    Pap Smear: Pap smear not completed today. Last Pap smear was 13 years ago at [facility] clinic  in [place] and was normal per Katrina Lara. Per Katrina Lara has no history of an abnormal Pap smear. Katrina Lara has a history of a hysterectomy in 2000 due to fibroids and AUB. Katrina Lara doesn't need any further Pap smears due to her history of a hysterectomy for benign reasons per BCCCP and ASCCP guidelines. Last Pap smear result is not available in Epic.   Physical exam: Breasts Breasts symmetrical. No skin abnormalities bilateral breasts. No nipple retraction bilateral breasts. No nipple discharge bilateral breasts. No lymphadenopathy. No lumps palpated bilateral breasts. No complaints of pan or tenderness on exam.      MS DIGITAL SCREENING TOMO BILATERAL  Result Date: 08/24/2020 CLINICAL DATA:  Screening. EXAM: DIGITAL SCREENING BILATERAL MAMMOGRAM WITH TOMOSYNTHESIS AND CAD TECHNIQUE: Bilateral screening digital craniocaudal and mediolateral oblique mammograms were obtained. Bilateral screening digital breast tomosynthesis was performed. The images were evaluated with computer-aided detection. COMPARISON:  Previous exam(s). ACR Breast Density Category a: The breast tissue is almost entirely fatty. FINDINGS: There are no findings suspicious for malignancy. The images were evaluated with computer-aided detection. IMPRESSION: No mammographic evidence of malignancy. A result letter of this screening mammogram will be mailed directly to the Katrina Lara. RECOMMENDATION: Screening mammogram in one year. (Code:SM-B-01Y) BI-RADS CATEGORY  1: Negative. Electronically Signed   By: Harmon Pier M.D.   On: 08/24/2020 12:33   MS DIGITAL SCREENING TOMO BILATERAL  Result Date: 06/02/2019 CLINICAL DATA:  Screening. EXAM: DIGITAL SCREENING BILATERAL MAMMOGRAM WITH TOMO AND CAD COMPARISON:  Previous exam(s). ACR Breast Density Category  b: There are scattered areas of fibroglandular density. FINDINGS: There are no findings suspicious for malignancy. Images were processed with CAD. IMPRESSION: No mammographic evidence of malignancy. A result letter of this screening mammogram will be mailed directly to the Katrina Lara. RECOMMENDATION: Screening mammogram in one year. (Code:SM-B-01Y) BI-RADS CATEGORY  1: Negative. Electronically Signed   By: Emmaline Kluver M.D.   On: 06/02/2019 13:23   MM 3D SCREEN BREAST BILATERAL  Result Date: 05/28/2018 CLINICAL DATA:  Screening. EXAM: DIGITAL SCREENING BILATERAL MAMMOGRAM WITH TOMO AND CAD COMPARISON:  Previous exam(s). ACR Breast Density Category b: There are scattered areas of fibroglandular density. FINDINGS: There are no findings suspicious for malignancy. Images were processed with CAD. IMPRESSION: No mammographic evidence of malignancy. A result letter of this screening mammogram will be mailed directly to the Katrina Lara. RECOMMENDATION: Screening mammogram in one year. (Code:SM-B-01Y) BI-RADS CATEGORY  1: Negative. Electronically Signed   By: Ted Mcalpine M.D.   On: 05/28/2018 17:02   MM SCREENING BREAST TOMO BILATERAL  Result Date: 05/28/2017 CLINICAL DATA:  Screening. EXAM: DIGITAL SCREENING BILATERAL MAMMOGRAM WITH TOMO AND CAD COMPARISON:  Previous exam(s). ACR Breast Density Category b: There are scattered areas of fibroglandular density. FINDINGS: There are no findings suspicious for malignancy. Images were processed with CAD. IMPRESSION: No mammographic evidence of malignancy. A result letter of this screening mammogram will be mailed directly to the Katrina Lara. RECOMMENDATION: Screening mammogram in one year. (Code:SM-B-01Y) BI-RADS CATEGORY  1: Negative. Electronically Signed   By: Annia Belt M.D.   On: 05/28/2017 10:19    Pelvic/Bimanual Pap is not indicated today per BCCCP guidelines.   Smoking History: Katrina Lara has never smoked.   Katrina Lara Navigation: Katrina Lara education provided.  Access to services provided for  Katrina Lara through [facility] program.   Colorectal Cancer Screening: Per Katrina Lara had a colonoscopy completed in 2020. FIT Test given to Katrina Lara to complete. No complaints today.    Breast and Cervical Cancer Risk Assessment: Katrina Lara does not have family history of breast cancer, known genetic mutations, or radiation treatment to the chest before age 63. Katrina Lara does not have history of cervical dysplasia, immunocompromised, or DES exposure in-utero.  Risk Assessment     Risk Scores       12/04/2021 08/24/2020   Last edited by: Narda Rutherford, LPN Scarlett Presto, RN   5-year risk: 1.2 % 1.2 %   Lifetime risk: 4.9 % 5 %            A: BCCCP exam without pap smear No complaints.  P: Katrina Lara to the [facility] for a screening mammogram. Appointment scheduled Monday, December 04, 2021 at 1400.  Katrina Heidelberg, RN 12/04/2021 1:23 PM

## 2021-12-07 ENCOUNTER — Other Ambulatory Visit: Payer: Self-pay

## 2021-12-12 LAB — FECAL OCCULT BLOOD, IMMUNOCHEMICAL: Fecal Occult Bld: NEGATIVE

## 2021-12-14 ENCOUNTER — Telehealth: Payer: Self-pay

## 2021-12-14 NOTE — Telephone Encounter (Signed)
Attempted to contact patient regarding lab results. Left message on voicemail requesting a return call.  

## 2021-12-25 ENCOUNTER — Other Ambulatory Visit: Payer: Self-pay

## 2021-12-27 ENCOUNTER — Telehealth: Payer: Self-pay

## 2021-12-27 NOTE — Telephone Encounter (Signed)
Attempted to contact patient regarding lab results. Left message on voicemail requesting a return call.  

## 2022-01-11 ENCOUNTER — Other Ambulatory Visit: Payer: Self-pay

## 2022-01-22 ENCOUNTER — Ambulatory Visit (LOCAL_COMMUNITY_HEALTH_CENTER): Payer: Self-pay

## 2022-01-22 DIAGNOSIS — Z23 Encounter for immunization: Secondary | ICD-10-CM

## 2022-01-22 DIAGNOSIS — Z719 Counseling, unspecified: Secondary | ICD-10-CM

## 2022-01-22 NOTE — Progress Notes (Signed)
  Are you feeling sick today? No   Have you ever received a dose of COVID-19 Vaccine? [employer], [place], [place], [place], Other) Yes  If yes, which vaccine and how many doses?   3 doses Pfizr   Did you bring the vaccination record card or other documentation?  Yes   Do you have a health condition or are undergoing treatment that makes you moderately or severely immunocompromised? This would include, but not be limited to: cancer, HIV, organ transplant, immunosuppressive therapy/high-dose corticosteroids, or moderate/severe primary immunodeficiency.  No  Have you received COVID-19 vaccine before or during hematopoietic cell transplant (HCT) or CAR-T-cell therapies? No  Have you ever had an allergic reaction to: (This would include a severe allergic reaction or a reaction that caused hives, swelling, or respiratory distress, including wheezing.) A component of a COVID-19 vaccine or a previous dose of COVID-19 vaccine? No   Have you ever had an allergic reaction to another vaccine (other thanCOVID-19 vaccine) or an injectable medication? (This would include a severe allergic reaction or a reaction that caused hives, swelling, or respiratory distress, including wheezing.)   No    Do you have a history of any of the following:  Myocarditis or Pericarditis No  Dermal fillers:  No  Multisystem Inflammatory Syndrome (MIS-C or MIS-A)? No  COVID-19 disease within the past 3 months? No  Vaccinated with monkeypox vaccine in the last 4 weeks? No  Flu IM Lt deltoid and Comirnaty +12Y 2023-24 IM Right deltoid. Tolerated well. Flu cost discussed. COVID card updated and [facility] updated and copy provided to patient. VIS provided.

## 2022-02-05 ENCOUNTER — Other Ambulatory Visit: Payer: Self-pay

## 2022-03-07 ENCOUNTER — Telehealth: Payer: Self-pay

## 2022-03-07 NOTE — Telephone Encounter (Signed)
Normal FIT test result letter mailed.   March 07, 2022      Dear Mrs. Katrina Lara,   Thank you for returning the FIT Test (Fecal Immunochemical Test) as part of a screening for colorectal cancer. We are pleased to inform you that your test result is negative.  The FIT test is not a complete colorectal cancer detection examination. Not all colorectal cancers are found by this test. It is important that you share these results with your physician. Also, please see your physician if symptoms develop such as a change in bowel habits, abdominal pain, or blood in your stools.  If you have any questions concerning these test results, please call Itai Barbian at  [phone].  Sincerely,    Clois Dupes, LPN [employer]

## 2022-03-19 ENCOUNTER — Other Ambulatory Visit: Payer: Self-pay

## 2022-04-10 ENCOUNTER — Other Ambulatory Visit: Payer: Self-pay

## 2022-04-10 MED ORDER — AZITHROMYCIN 250 MG PO TABS
ORAL_TABLET | ORAL | 0 refills | Status: AC
  Filled 2022-04-10: qty 6, 5d supply, fill #0

## 2022-04-12 ENCOUNTER — Other Ambulatory Visit: Payer: Self-pay

## 2022-04-12 MED ORDER — HYDROCHLOROTHIAZIDE 25 MG PO TABS
25.0000 mg | ORAL_TABLET | Freq: Every day | ORAL | 1 refills | Status: AC
  Filled 2022-04-12: qty 90, 90d supply, fill #0

## 2022-05-14 ENCOUNTER — Other Ambulatory Visit: Payer: Self-pay

## 2022-06-13 ENCOUNTER — Other Ambulatory Visit: Payer: Self-pay

## 2022-06-13 MED ORDER — POTASSIUM CHLORIDE ER 10 MEQ PO TBCR
10.0000 meq | EXTENDED_RELEASE_TABLET | Freq: Two times a day (BID) | ORAL | 3 refills | Status: AC
  Filled 2022-06-13: qty 180, 90d supply, fill #0
  Filled 2022-10-11: qty 180, 90d supply, fill #1
  Filled 2022-12-24: qty 180, 90d supply, fill #2

## 2022-06-13 MED ORDER — LEVOTHYROXINE SODIUM 100 MCG PO TABS
100.0000 ug | ORAL_TABLET | Freq: Every day | ORAL | 3 refills | Status: DC
  Filled 2022-07-09: qty 90, 90d supply, fill #0
  Filled 2022-10-11: qty 90, 90d supply, fill #1
  Filled 2023-01-08: qty 90, 90d supply, fill #2
  Filled 2023-04-11: qty 90, 90d supply, fill #3

## 2022-06-13 MED ORDER — HYDROCHLOROTHIAZIDE 25 MG PO TABS
25.0000 mg | ORAL_TABLET | Freq: Every day | ORAL | 3 refills | Status: AC
Start: 2022-06-13 — End: ?
  Filled 2022-06-13: qty 90, 90d supply, fill #0

## 2022-07-09 ENCOUNTER — Other Ambulatory Visit: Payer: Self-pay

## 2022-07-10 ENCOUNTER — Other Ambulatory Visit: Payer: Self-pay

## 2022-08-13 ENCOUNTER — Other Ambulatory Visit: Payer: Self-pay

## 2022-08-13 MED ORDER — HYDROCHLOROTHIAZIDE 25 MG PO TABS
25.0000 mg | ORAL_TABLET | Freq: Every day | ORAL | 3 refills | Status: AC
Start: 2022-08-13 — End: ?
  Filled 2022-08-13: qty 90, 90d supply, fill #0
  Filled 2022-12-24: qty 90, 90d supply, fill #1
  Filled 2023-05-29: qty 90, 90d supply, fill #2

## 2022-08-17 ENCOUNTER — Other Ambulatory Visit: Payer: Self-pay

## 2022-09-11 IMAGING — MG MM DIGITAL SCREENING BILAT W/ TOMO AND CAD
6 of 10 series · 6 of 30 positions shown · non-contrast
Comparison: Previous exam(s).

ACR Breast Density Category a: The breast tissue is almost entirely
fatty.

CLINICAL DATA: Screening.

EXAM:
DIGITAL SCREENING BILATERAL MAMMOGRAM WITH TOMOSYNTHESIS AND CAD
TECHNIQUE: Bilateral screening digital craniocaudal and mediolateral oblique
mammograms were obtained. Bilateral screening digital breast
tomosynthesis was performed. The images were evaluated with
computer-aided detection.

[L MLO synth-2D]
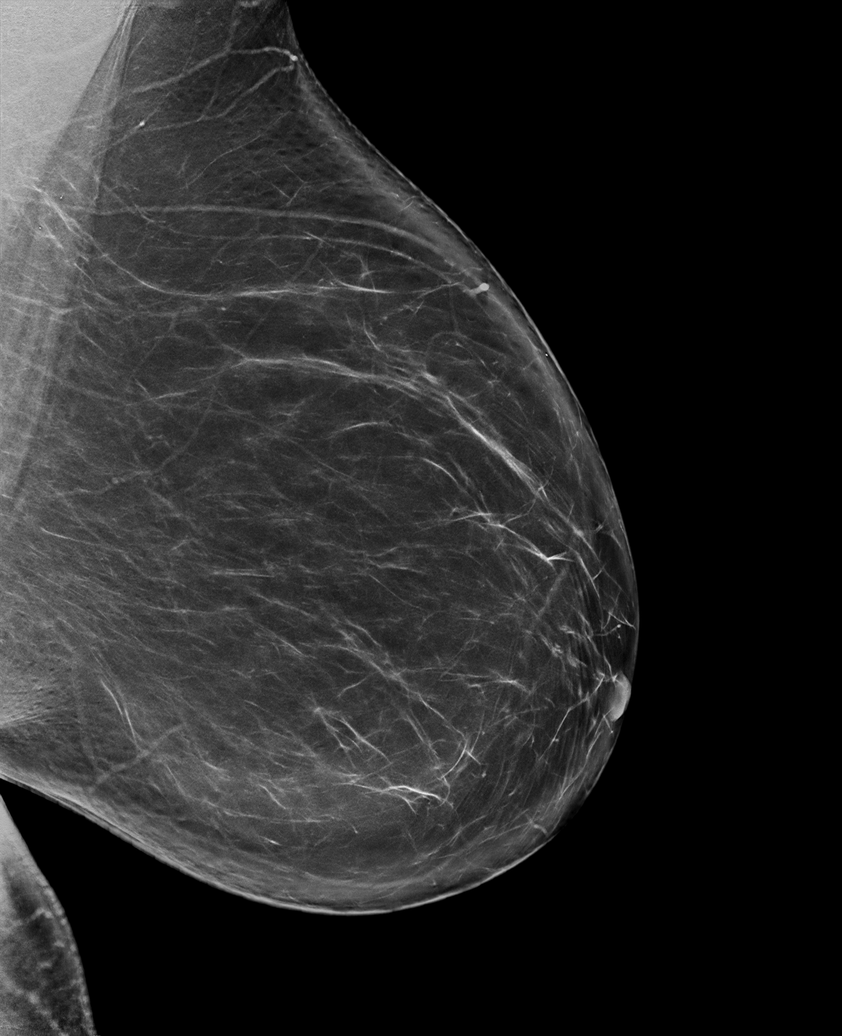

[R MLO synth-2D (1 of 2)]
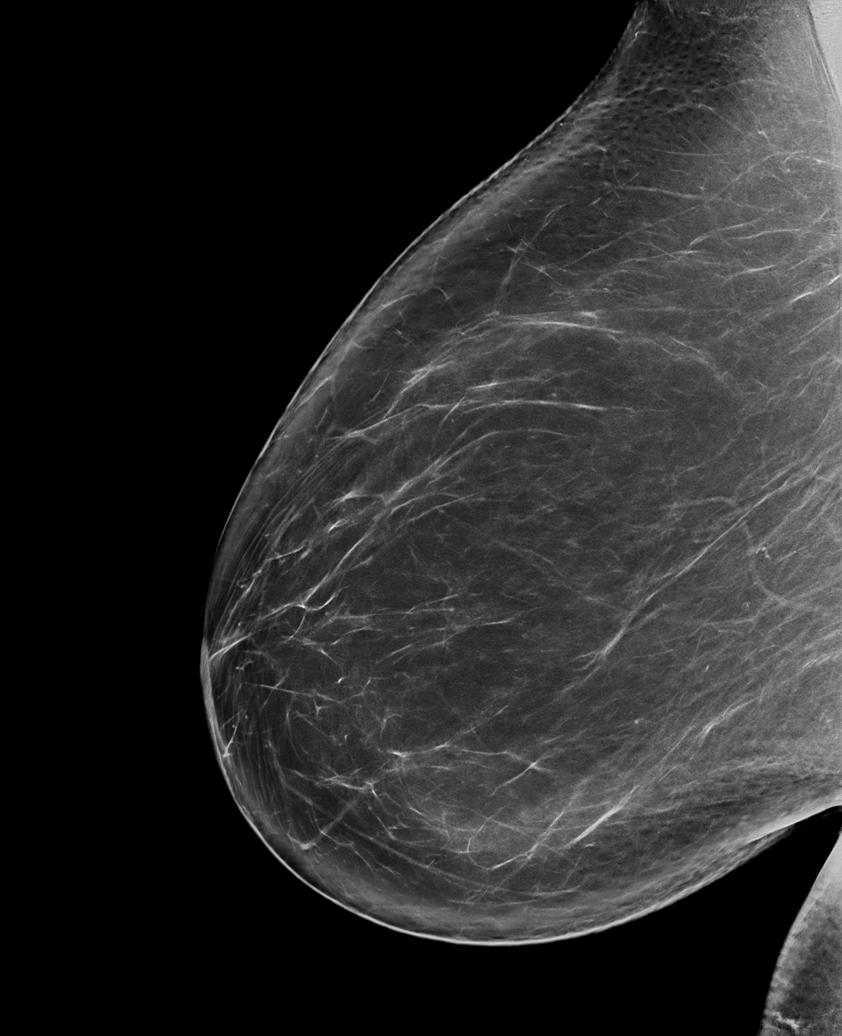

[L CC synth-2D]
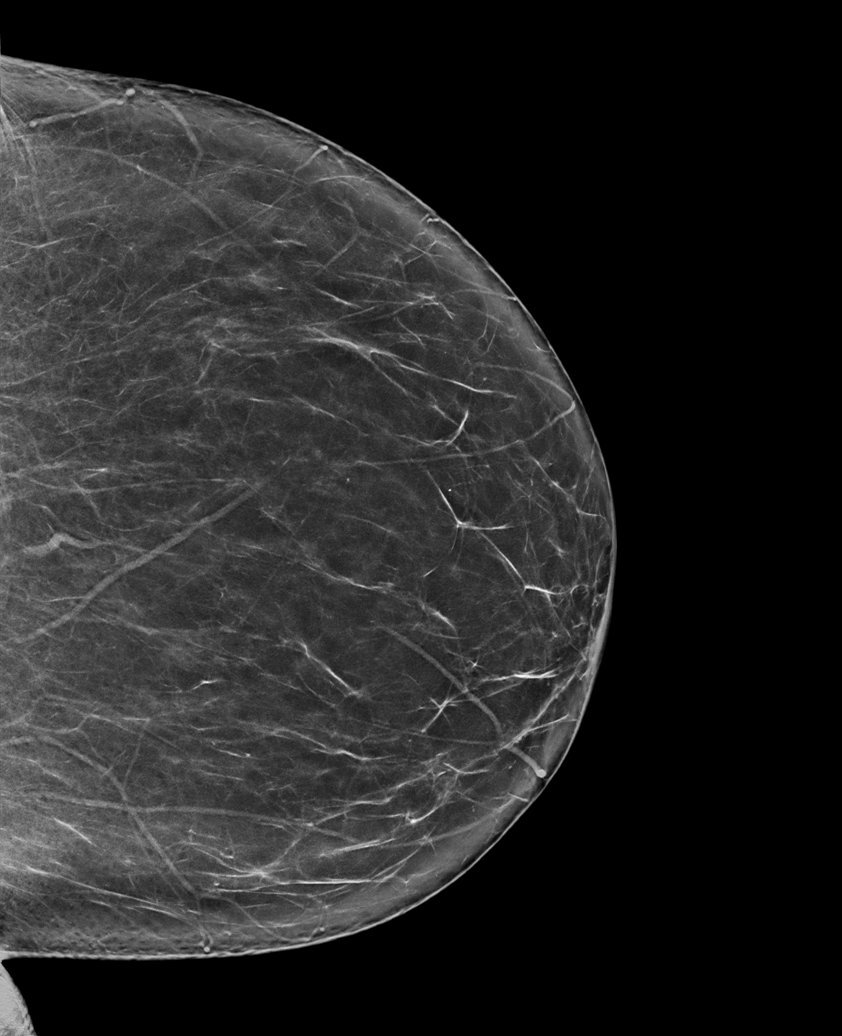

[R CC synth-2D]
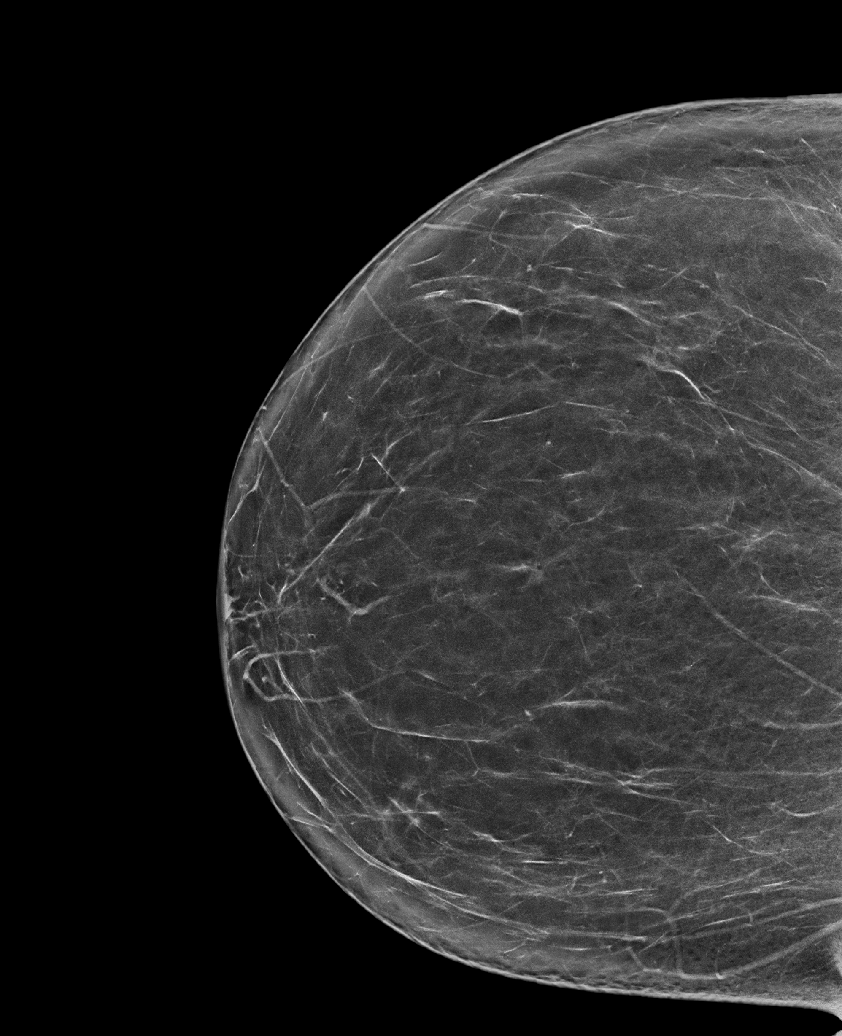

[R MLO synth-2D (2 of 2)]
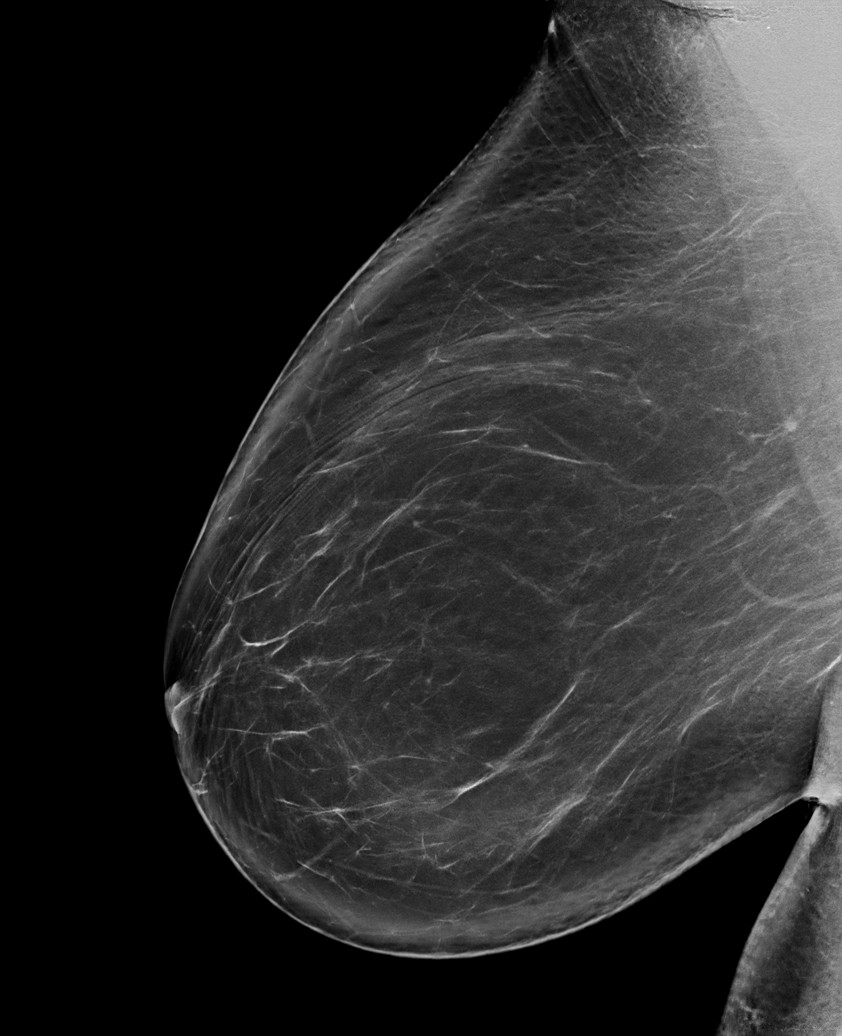

[R MLO tomo · tomo slice 49/98.0]
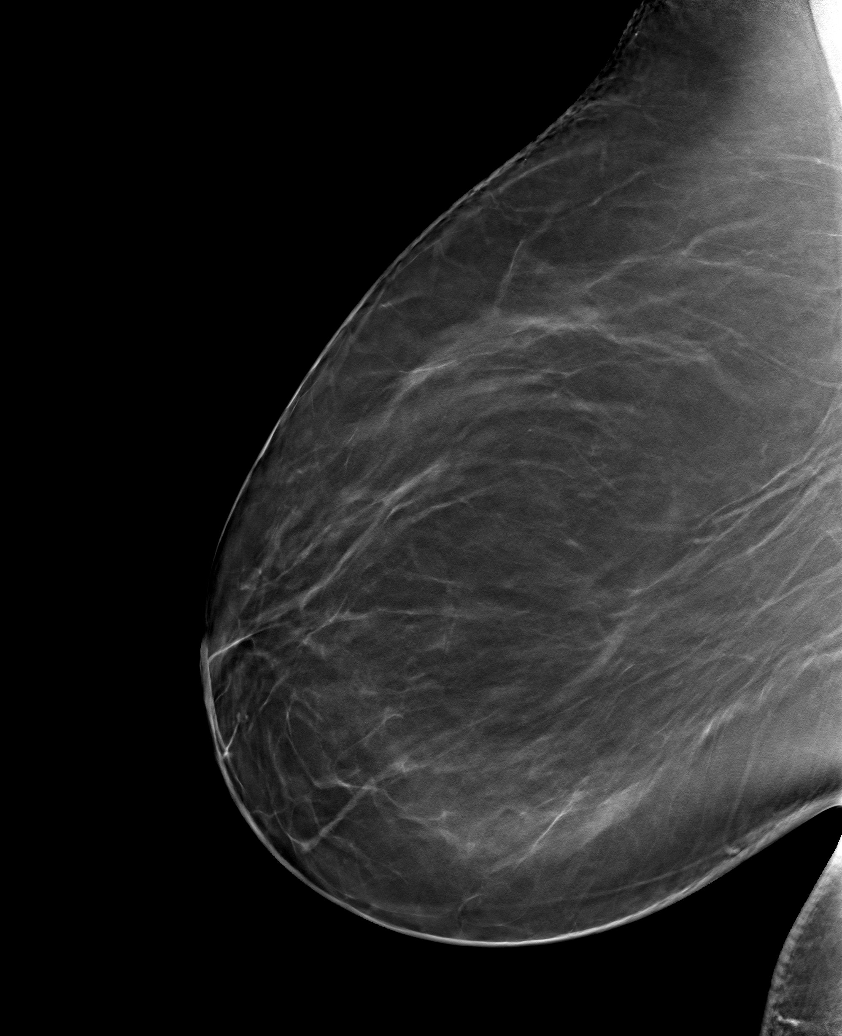

[6 of 30 positions shown; findings below may reference images not displayed]

FINDINGS: There are no findings suspicious for malignancy. The images were
evaluated with computer-aided detection.
IMPRESSION: No mammographic evidence of malignancy. A result letter of this
screening mammogram will be mailed directly to the patient.

RECOMMENDATION:
Screening mammogram in one year. (Code:JP-[id number])

BI-RADS CATEGORY  1: Negative.

## 2022-10-11 ENCOUNTER — Other Ambulatory Visit: Payer: Self-pay

## 2022-10-12 ENCOUNTER — Other Ambulatory Visit: Payer: Self-pay

## 2022-12-03 ENCOUNTER — Other Ambulatory Visit: Payer: Self-pay

## 2022-12-24 ENCOUNTER — Other Ambulatory Visit: Payer: Self-pay

## 2023-01-08 ENCOUNTER — Other Ambulatory Visit: Payer: Self-pay

## 2023-01-15 ENCOUNTER — Other Ambulatory Visit: Payer: Self-pay

## 2023-01-15 DIAGNOSIS — I10 Essential (primary) hypertension: Secondary | ICD-10-CM | POA: Diagnosis not present

## 2023-01-15 DIAGNOSIS — L259 Unspecified contact dermatitis, unspecified cause: Secondary | ICD-10-CM | POA: Diagnosis not present

## 2023-01-15 DIAGNOSIS — Z23 Encounter for immunization: Secondary | ICD-10-CM | POA: Diagnosis not present

## 2023-01-15 MED ORDER — HYDROXYZINE HCL 25 MG PO TABS
25.0000 mg | ORAL_TABLET | Freq: Three times a day (TID) | ORAL | 0 refills | Status: AC | PRN
  Filled 2023-01-15: qty 30, 10d supply, fill #0

## 2023-01-15 MED ORDER — CEPHALEXIN 500 MG PO CAPS
500.0000 mg | ORAL_CAPSULE | Freq: Three times a day (TID) | ORAL | 0 refills | Status: AC
  Filled 2023-01-15: qty 21, 7d supply, fill #0

## 2023-01-15 MED ORDER — PREDNISONE 20 MG PO TABS
20.0000 mg | ORAL_TABLET | Freq: Every day | ORAL | 0 refills | Status: AC
  Filled 2023-01-15: qty 7, 7d supply, fill #0

## 2023-03-06 ENCOUNTER — Other Ambulatory Visit: Payer: Self-pay

## 2023-03-06 MED ORDER — AZITHROMYCIN 250 MG PO TABS
ORAL_TABLET | ORAL | 0 refills | Status: AC
  Filled 2023-03-06: qty 6, 5d supply, fill #0

## 2023-06-20 ENCOUNTER — Other Ambulatory Visit: Payer: Self-pay

## 2023-06-20 DIAGNOSIS — Z0000 Encounter for general adult medical examination without abnormal findings: Secondary | ICD-10-CM | POA: Diagnosis not present

## 2023-06-20 DIAGNOSIS — Z6841 Body Mass Index (BMI) 40.0 and over, adult: Secondary | ICD-10-CM | POA: Diagnosis not present

## 2023-06-20 DIAGNOSIS — R739 Hyperglycemia, unspecified: Secondary | ICD-10-CM | POA: Diagnosis not present

## 2023-06-20 DIAGNOSIS — Z79899 Other long term (current) drug therapy: Secondary | ICD-10-CM | POA: Diagnosis not present

## 2023-06-20 DIAGNOSIS — E669 Obesity, unspecified: Secondary | ICD-10-CM | POA: Diagnosis not present

## 2023-06-20 MED ORDER — TERBINAFINE HCL 250 MG PO TABS
250.0000 mg | ORAL_TABLET | Freq: Every day | ORAL | 0 refills | Status: AC
  Filled 2023-06-20: qty 14, 14d supply, fill #0

## 2023-06-20 MED ORDER — HYDROCHLOROTHIAZIDE 25 MG PO TABS
25.0000 mg | ORAL_TABLET | Freq: Every day | ORAL | 3 refills | Status: AC
  Filled 2023-06-20 – 2023-08-19 (×2): qty 90, 90d supply, fill #0
  Filled 2023-12-04: qty 90, 90d supply, fill #1
  Filled 2024-03-13: qty 90, 90d supply, fill #2

## 2023-06-20 MED ORDER — POTASSIUM CHLORIDE ER 10 MEQ PO TBCR
10.0000 meq | EXTENDED_RELEASE_TABLET | Freq: Two times a day (BID) | ORAL | 3 refills | Status: AC
  Filled 2023-06-20 – 2023-08-19 (×2): qty 180, 90d supply, fill #0
  Filled 2023-12-04: qty 180, 90d supply, fill #1
  Filled 2024-03-13 (×2): qty 180, 90d supply, fill #2

## 2023-06-20 MED ORDER — LEVOTHYROXINE SODIUM 100 MCG PO TABS
100.0000 ug | ORAL_TABLET | Freq: Every day | ORAL | 3 refills | Status: AC
  Filled 2023-06-20 – 2023-07-09 (×2): qty 90, 90d supply, fill #0
  Filled 2023-10-09: qty 90, 90d supply, fill #1
  Filled 2024-01-08: qty 90, 90d supply, fill #2
  Filled 2024-04-07: qty 90, 90d supply, fill #3

## 2023-07-03 ENCOUNTER — Other Ambulatory Visit: Payer: Self-pay

## 2023-07-09 ENCOUNTER — Other Ambulatory Visit: Payer: Self-pay

## 2023-08-19 ENCOUNTER — Other Ambulatory Visit: Payer: Self-pay

## 2023-08-21 ENCOUNTER — Other Ambulatory Visit: Payer: Self-pay

## 2023-08-21 MED ORDER — POTASSIUM CHLORIDE ER 10 MEQ PO TBCR: 10.0000 meq | EXTENDED_RELEASE_TABLET | Freq: Two times a day (BID) | ORAL | 3 refills | Status: AC

## 2023-10-30 ENCOUNTER — Other Ambulatory Visit: Payer: Self-pay

## 2023-10-30 MED ORDER — AMOXICILLIN 500 MG PO CAPS
500.0000 mg | ORAL_CAPSULE | Freq: Three times a day (TID) | ORAL | 0 refills | Status: AC
Start: 2023-10-22 — End: ?
  Filled 2023-10-30: qty 30, 10d supply, fill #0

## 2023-10-30 MED ORDER — IBUPROFEN 800 MG PO TABS
800.0000 mg | ORAL_TABLET | Freq: Three times a day (TID) | ORAL | 0 refills | Status: AC | PRN
  Filled 2023-10-30: qty 30, 10d supply, fill #0

## 2024-01-08 ENCOUNTER — Other Ambulatory Visit: Payer: Self-pay

## 2024-03-13 ENCOUNTER — Other Ambulatory Visit: Payer: Self-pay

## 2024-03-19 ENCOUNTER — Other Ambulatory Visit: Payer: Self-pay

## 2024-03-19 MED ORDER — CEFDINIR 300 MG PO CAPS
300.0000 mg | ORAL_CAPSULE | Freq: Two times a day (BID) | ORAL | 0 refills | Status: AC
  Filled 2024-03-19: qty 14, 7d supply, fill #0

## 2024-03-19 MED ORDER — PREDNISONE 10 MG PO TABS
10.0000 mg | ORAL_TABLET | Freq: Every day | ORAL | 0 refills | Status: AC
  Filled 2024-03-19: qty 10, 10d supply, fill #0

## 2024-04-07 ENCOUNTER — Other Ambulatory Visit: Payer: Self-pay

## 2024-04-08 ENCOUNTER — Other Ambulatory Visit: Payer: Self-pay
